# Patient Record
Sex: Female | Born: 1975
Health system: Southern US, Community
[De-identification: ages and names within clinical notes are randomized; demographics above are authoritative.]

## PROBLEM LIST (undated history)

## (undated) DIAGNOSIS — J45909 Unspecified asthma, uncomplicated: Secondary | ICD-10-CM

## (undated) DIAGNOSIS — K219 Gastro-esophageal reflux disease without esophagitis: Secondary | ICD-10-CM

## (undated) DIAGNOSIS — F419 Anxiety disorder, unspecified: Secondary | ICD-10-CM

## (undated) HISTORY — DX: Unspecified asthma, uncomplicated: J45.909

## (undated) HISTORY — PX: CHOLECYSTECTOMY: SHX55

## (undated) HISTORY — DX: Gastro-esophageal reflux disease without esophagitis: K21.9

---

## 2005-12-26 ENCOUNTER — Ambulatory Visit (HOSPITAL_COMMUNITY): Admission: RE | Admit: 2005-12-26 | Discharge: 2005-12-26 | Payer: Self-pay | Admitting: Family Medicine

## 2005-12-26 ENCOUNTER — Ambulatory Visit: Payer: Self-pay | Admitting: Family Medicine

## 2006-01-10 ENCOUNTER — Ambulatory Visit: Payer: Self-pay | Admitting: Gynecology

## 2006-01-24 ENCOUNTER — Ambulatory Visit: Payer: Self-pay | Admitting: Obstetrics & Gynecology

## 2006-01-31 ENCOUNTER — Ambulatory Visit: Payer: Self-pay | Admitting: Family Medicine

## 2006-02-07 ENCOUNTER — Ambulatory Visit: Payer: Self-pay | Admitting: Obstetrics & Gynecology

## 2006-02-14 ENCOUNTER — Ambulatory Visit: Payer: Self-pay | Admitting: Obstetrics & Gynecology

## 2006-02-19 ENCOUNTER — Ambulatory Visit: Payer: Self-pay | Admitting: Family Medicine

## 2006-02-21 ENCOUNTER — Ambulatory Visit: Payer: Self-pay | Admitting: Obstetrics & Gynecology

## 2006-02-23 ENCOUNTER — Ambulatory Visit: Payer: Self-pay | Admitting: Obstetrics & Gynecology

## 2006-02-28 ENCOUNTER — Ambulatory Visit: Payer: Self-pay | Admitting: Obstetrics and Gynecology

## 2006-02-28 ENCOUNTER — Inpatient Hospital Stay (HOSPITAL_COMMUNITY): Admission: AD | Admit: 2006-02-28 | Discharge: 2006-02-28 | Payer: Self-pay | Admitting: Family Medicine

## 2006-02-28 ENCOUNTER — Ambulatory Visit: Payer: Self-pay | Admitting: Obstetrics & Gynecology

## 2006-02-28 ENCOUNTER — Inpatient Hospital Stay (HOSPITAL_COMMUNITY): Admission: RE | Admit: 2006-02-28 | Discharge: 2006-03-03 | Payer: Self-pay | Admitting: Obstetrics and Gynecology

## 2006-04-12 ENCOUNTER — Encounter (INDEPENDENT_AMBULATORY_CARE_PROVIDER_SITE_OTHER): Payer: Self-pay | Admitting: Obstetrics & Gynecology

## 2006-04-12 ENCOUNTER — Ambulatory Visit: Payer: Self-pay | Admitting: Obstetrics & Gynecology

## 2007-04-07 ENCOUNTER — Inpatient Hospital Stay (HOSPITAL_COMMUNITY): Admission: AD | Admit: 2007-04-07 | Discharge: 2007-04-07 | Payer: Self-pay | Admitting: Obstetrics and Gynecology

## 2007-04-09 ENCOUNTER — Inpatient Hospital Stay (HOSPITAL_COMMUNITY): Admission: AD | Admit: 2007-04-09 | Discharge: 2007-04-09 | Payer: Self-pay | Admitting: Obstetrics & Gynecology

## 2007-12-12 ENCOUNTER — Ambulatory Visit (HOSPITAL_COMMUNITY): Admission: RE | Admit: 2007-12-12 | Discharge: 2007-12-12 | Payer: Self-pay | Admitting: Obstetrics & Gynecology

## 2008-08-06 ENCOUNTER — Encounter: Payer: Self-pay | Admitting: Obstetrics & Gynecology

## 2008-08-06 ENCOUNTER — Ambulatory Visit (HOSPITAL_COMMUNITY): Admission: AD | Admit: 2008-08-06 | Discharge: 2008-08-06 | Payer: Self-pay | Admitting: Obstetrics

## 2009-05-26 ENCOUNTER — Ambulatory Visit (HOSPITAL_COMMUNITY): Admission: RE | Admit: 2009-05-26 | Discharge: 2009-05-26 | Payer: Self-pay | Admitting: Obstetrics & Gynecology

## 2009-06-09 ENCOUNTER — Ambulatory Visit (HOSPITAL_COMMUNITY): Admission: RE | Admit: 2009-06-09 | Discharge: 2009-06-09 | Payer: Self-pay | Admitting: Obstetrics & Gynecology

## 2009-06-11 ENCOUNTER — Encounter: Payer: Self-pay | Admitting: Obstetrics & Gynecology

## 2009-06-11 ENCOUNTER — Ambulatory Visit (HOSPITAL_COMMUNITY): Admission: RE | Admit: 2009-06-11 | Discharge: 2009-06-11 | Payer: Self-pay | Admitting: Obstetrics & Gynecology

## 2009-09-30 ENCOUNTER — Encounter: Payer: Self-pay | Admitting: *Deleted

## 2009-10-21 ENCOUNTER — Ambulatory Visit: Payer: Self-pay | Admitting: Obstetrics and Gynecology

## 2009-10-21 LAB — CONVERTED CEMR LAB
AntiThromb III Func: 121 % (ref 76–126)
Anticardiolipin IgA: 1 (ref <10)
Anticardiolipin IgG: 3 (ref <10)
Anticardiolipin IgM: 3 (ref <10)
Homocysteine: 4.8 micromoles/L (ref 4.0–15.4)
Protein C Activity: 200 % — ABNORMAL HIGH (ref 75–133)
TSH: 1.616 microintl units/mL (ref 0.350–4.500)

## 2009-11-02 ENCOUNTER — Ambulatory Visit (HOSPITAL_COMMUNITY): Admission: RE | Admit: 2009-11-02 | Discharge: 2009-11-02 | Payer: Self-pay | Admitting: Family Medicine

## 2009-11-05 ENCOUNTER — Ambulatory Visit: Payer: Self-pay | Admitting: Obstetrics & Gynecology

## 2010-11-20 ENCOUNTER — Encounter: Payer: Self-pay | Admitting: Obstetrics & Gynecology

## 2011-02-04 LAB — CBC
HCT: 36.3 % (ref 36.0–46.0)
Platelets: 176 10*3/uL (ref 150–400)

## 2011-03-14 NOTE — Op Note (Signed)
[name], Tonya Summers               ACCOUNT NO.:  [id number]   MEDICAL RECORD NO.:  [id number]          PATIENT TYPE:  MAT   LOCATION:  [facility]                          [facility]:  [facility]   PHYSICIAN:  Roseanna Rainbow, M.D.DATE OF BIRTH:  Feb 04, 1976   DATE OF PROCEDURE:  08/06/2008  DATE OF DISCHARGE:                               OPERATIVE REPORT   PREOPERATIVE DIAGNOSIS:  Inevitable abortion.   POSTOPERATIVE DIAGNOSIS:  Inevitable abortion.   PROCEDURE:  Suction dilatation and curettage.   SURGEON:  Roseanna Rainbow, MD   ANESTHESIA:  Anesthesia care, paracervical block.   PATHOLOGY:  Products of conception.   ESTIMATED BLOOD LOSS:  100 mL.   COMPLICATIONS:  None.   PROCEDURE:  The patient was taken to the operating room with an IV  running.  She was placed in the dorsal lithotomy position and prepped  and draped in the usual sterile fashion.  After a time-out had been  completed, a bivalve speculum was placed in the patient's vagina.  The  cervix was noted to be approximately 1 cm dilated.  The anterior lip of  the cervix was then infiltrated with 2 mL of 2% lidocaine.  The single-  tooth tenaculum was then applied to this location.  4 mL of 2% lidocaine  were then injected at 4 o'clock and 7 o'clock to produce a paracervical  block.  A 7-mm suction curette was then advanced into the uterine  fundus.  The curette was then activated and rotated to evacuate the  uterus of the products of conception.  A sharp curettage was then  performed and gritty texture was noted.  A final pass was made with the  suction curette.  The single-tooth tenaculum was then removed with  minimal bleeding noted from the cervix.  At the close of the procedure,  the instrument and pack counts were said to be correct x2.  The patient  was taken to the PACU awake and in stable condition.      Roseanna Rainbow, M.D.  Electronically Signed     LAJ/MEDQ  D:  08/06/2008  T:  08/07/2008   Job:  [id number]

## 2011-03-14 NOTE — Op Note (Signed)
Tonya Summers, Tonya Summers               ACCOUNT NO.:  [id number]   MEDICAL RECORD NO.:  [id number]          PATIENT TYPE:  AMB   LOCATION:  [facility]                           [facility]   PHYSICIAN:  Roseanna Rainbow, M.D.DATE OF BIRTH:  09/06/1976   DATE OF PROCEDURE:  06/11/2009  DATE OF DISCHARGE:                               OPERATIVE REPORT   PREOPERATIVE DIAGNOSIS:  Embryonic demise.   POSTOPERATIVE DIAGNOSIS:  Embryonic demise.   PROCEDURES:  Suction, dilatation and curettage.   SURGEON:  Roseanna Rainbow, MD   ANESTHESIA:  Laryngeal mask airway, paracervical block.   PATHOLOGY:  Products of conception.   ESTIMATED BLOOD LOSS:  Minimal.   COMPLICATIONS:  None.   PROCEDURE IN DETAILS:  The patient was taken to the operating room with  an IV running.  A laryngeal mask airway was placed.  She was placed in  the dorsal lithotomy position and prepped and draped in the usual  sterile fashion.  After a time-out had been completed, a sterile  speculum was placed in the patient's vagina.  The anterior lip of the  cervix was infiltrated with 2 mL of 2% lidocaine.  The single-tooth  tenaculum was then applied to this location, 4 mL of 2% lidocaine were  then injected at 4 and 7 o'clock to produce a paracervical block.  The  cervix was then dilated with [facility] dilators.  A 7-mm suction curette was  then introduced into the intrauterine cavity.  The curette was activated  and rotated to evacuate the uterus of the products of conception.  A  sharp curettage was performed and a gritty texture was noted.  A final  pass was made with the suction curette.  The single-tooth tenaculum was  removed with minimal bleeding noted from the cervix.  At the end of the  procedure, the instrument and pack counts were said to be correct x2.  The patient was taken to the PACU awake and in stable condition.      Roseanna Rainbow, M.D.  Electronically Signed     LAJ/MEDQ  D:   06/11/2009  T:  06/11/2009  Job:  [id number]

## 2011-03-17 NOTE — Group Therapy Note (Signed)
NAMEROYETTA, PROBUS               ACCOUNT NO.:  [id number]   MEDICAL RECORD NO.:  [id number]          PATIENT TYPE:  WOC   LOCATION:  [facility]                   FACILITY:  [facility]   PHYSICIAN:  Dorthula Perfect, MD     DATE OF BIRTH:  29-Aug-1976   DATE OF SERVICE:                                    CLINIC NOTE   This 35 year old African female gravida 1 and now para 1, delivered  vaginally approximately 6 weeks ago.  She is both breast feeding and bottle  feeding.  She has had no vaginal bleeding. She is here for her 6-week  postpartum check.  I cannot find when her last Pap smear was done.   REVIEW OF SYSTEMS:  She is asymptomatic. She has no GI or GU complaints.   PERTINENT PHYSICAL EXAM:  ABDOMEN:  Soft and nontender.  PELVIC EXAMINATION:  External genitalia reveals moderate evidence of female  circumcision. The vaginal introitus is about 2 cm in diameter. A narrow  speculum is gently inserted and this was somewhat uncomfortable for the  patient.  The cervix is visualized. A Pap smear is done. Bimanual  examination is deferred because of the patient's cultural background.   IMPRESSION:  Normal 6-week postpartum exam.   DISPOSITION:  1.  Pap smear is done.  2.  The patient will be seen p.r.n. for care.  3.  The patient is told, through her mother who acts as an interpreter, that      she can expect a period in the next 3-6 months or longer. She is going      to breast feed for the next 6 months.           ______________________________  Dorthula Perfect, MD     ER/MEDQ  D:  04/12/2006  T:  04/12/2006  Job:  [id number]

## 2011-07-31 LAB — CBC
HCT: 37.3
WBC: 7.2

## 2011-08-17 LAB — CBC
HCT: 35 — ABNORMAL LOW
Hemoglobin: 11.9 — ABNORMAL LOW
Hemoglobin: 13.3
MCHC: 34.2
Platelets: 234
RBC: 4.44
RDW: 13.1
WBC: 6.5
WBC: 8.6

## 2011-08-17 LAB — WET PREP, GENITAL
Trich, Wet Prep: NONE SEEN
Yeast Wet Prep HPF POC: NONE SEEN

## 2011-08-17 LAB — GC/CHLAMYDIA PROBE AMP, GENITAL: Chlamydia, DNA Probe: NEGATIVE

## 2014-08-11 ENCOUNTER — Ambulatory Visit (INDEPENDENT_AMBULATORY_CARE_PROVIDER_SITE_OTHER): Payer: Self-pay | Admitting: Physician Assistant

## 2014-08-11 VITALS — BP 110/72 | HR 111 | Temp 98.1°F | Resp 18 | Ht 66.0 in | Wt 161.0 lb

## 2014-08-11 DIAGNOSIS — K219 Gastro-esophageal reflux disease without esophagitis: Secondary | ICD-10-CM

## 2014-08-11 MED ORDER — SUCRALFATE 1 G PO TABS: 1.0000 g | ORAL_TABLET | Freq: Three times a day (TID) | ORAL | Status: DC

## 2014-08-11 MED ORDER — OMEPRAZOLE 40 MG PO CPDR: 40.0000 mg | DELAYED_RELEASE_CAPSULE | Freq: Every day | ORAL | Status: DC

## 2014-08-11 MED ORDER — SUCRALFATE 1 GM/10ML PO SUSP: 1.0000 g | Freq: Three times a day (TID) | ORAL | Status: DC

## 2014-08-11 NOTE — Progress Notes (Signed)
I was directly involved with the patient's care and agree with the physical, diagnosis and treatment plan.  

## 2014-08-11 NOTE — Progress Notes (Signed)
   Subjective:    Patient ID: Tonya Summers, female    DOB: Sep 06, 1976, 38 y.o.   MRN: [id number]  HPI Patient presents for 7 days of worsening heartburn. Has had acid reflux for over a year and is usually controlled with Zantac 75. Has epigastric pain that usually does not radiate, but she feels it in her throat some mornings and has a sour taste in mouths. Has had 2 episode of yellowish spit-up. Denies nausea, diarrhea, or constipation. Eats a lot of spicy food. Denies drinking soda, coffee, or tea.   Review of Systems  Constitutional: Negative for fever and appetite change.  HENT: Negative for sore throat.   Respiratory: Negative for cough, chest tightness, shortness of breath and wheezing.   Cardiovascular: Negative for chest pain and palpitations.  Gastrointestinal: Positive for vomiting (minimal; 2 episodes). Negative for nausea, abdominal pain, diarrhea and constipation.  Allergic/Immunologic: Negative for environmental allergies and food allergies.       Objective:   Physical Exam  Constitutional: She is oriented to person, place, and time. She appears well-developed and well-nourished. No distress.  HENT:  Head: Normocephalic and atraumatic.  Right Ear: External ear normal.  Left Ear: External ear normal.  Mouth/Throat: Oropharynx is clear and moist. No oropharyngeal exudate.  Neck: Neck supple. No JVD present. No thyromegaly present.  Cardiovascular: Normal rate, regular rhythm, normal heart sounds and intact distal pulses.   No murmur heard. Pulmonary/Chest: Effort normal and breath sounds normal. No respiratory distress. She has no wheezes. She has no rales.  Abdominal: Soft. Normal appearance and bowel sounds are normal. She exhibits no distension and no mass. There is tenderness in the epigastric area. There is no rebound, no guarding and no CVA tenderness.  Lymphadenopathy:    She has no cervical adenopathy.  Neurological: She is alert and oriented to person, place, and  time.  Skin: Skin is warm and dry. No rash noted. She is not diaphoretic. No erythema. No pallor.   Blood pressure 110/72, pulse 111, temperature 98.1 F (36.7 C), temperature source Oral, resp. rate 18, height 5\' 6"  (1.676 m), weight 161 lb (73.029 kg), last menstrual period 07/24/2014, SpO2 100.00%.      Assessment & Plan:  1. Gastroesophageal reflux disease without esophagitis - HELICOBACTER PYLORI  ANTIBODY, IGM - omeprazole (PRILOSEC) 40 MG capsule; Take 1 capsule (40 mg total) by mouth daily.  Dispense: 30 capsule; Refill: 3 - sucralfate (CARAFATE) 1 G tablet; Take 1 tablet (1 g total) by mouth 4 (four) times daily -  with meals and at bedtime.  Dispense: 28 tablet; Refill: 0 - Continue to Zantac. Take 2 tablets daily prn.  Alveta Heimlich [name]  [facility] 08/11/2014 8:29 PM

## 2014-08-14 LAB — HELICOBACTER PYLORI  ANTIBODY, IGM: Helicobacter pylori, IgM: 0.7 U/mL (ref <9.0)

## 2014-08-19 ENCOUNTER — Telehealth: Payer: Self-pay | Admitting: Radiology

## 2014-08-19 NOTE — Telephone Encounter (Signed)
Pt's husband calling about lab results. They have not been reviewed yet. Please review and call him at [phone] with results. Thanks

## 2014-08-19 NOTE — Telephone Encounter (Signed)
Tishira, pt's husband stopped by. I let him know that her H Pylori was negative and that we would call him if you had in further comments.

## 2014-09-09 ENCOUNTER — Ambulatory Visit (INDEPENDENT_AMBULATORY_CARE_PROVIDER_SITE_OTHER): Payer: Self-pay | Admitting: Family Medicine

## 2014-09-09 VITALS — BP 128/74 | HR 101 | Temp 98.7°F | Resp 18 | Ht 67.0 in | Wt 154.0 lb

## 2014-09-09 DIAGNOSIS — K219 Gastro-esophageal reflux disease without esophagitis: Secondary | ICD-10-CM

## 2014-09-09 DIAGNOSIS — R1900 Intra-abdominal and pelvic swelling, mass and lump, unspecified site: Secondary | ICD-10-CM

## 2014-09-09 NOTE — Progress Notes (Signed)
I was asked to see the patient along with Brewington, [name] because of the extremely pulsatile abdominal aorta. The patient is only 64 his old, is a nonsmoker, but has a very pulsatile abdominal aorta in the epigastric region. No bruits could be auscultated. I do agree with recommendation that we get an ultrasound to make sure the aorta is normal.  Priscille Heidelberg.D.

## 2014-09-09 NOTE — Progress Notes (Signed)
   Subjective:    Patient ID: Tonya Summers, female    DOB: 1976/05/20, 38 y.o.   MRN: [id number]  HPI Patient presents because she is unsure if she needs a GI referral for her GERD. After treatment one month ago (08/11/14) with Zanctac, Prilosec, and Carafate, sx of epigastric pain, nausea, and sour brash only marginally improved. Went to another urgent care and received protonix, which helped. Sx are improved, but due to having brother die of colon cancer would like to make sure she does not need further eval.  On exam pulsatile mass found. Patient unsure when first appeared. Denies HA, dizziness, or orthopnea. Is not a smoker.   Review of Systems  Constitutional: Positive for appetite change (decreased). Negative for fever and fatigue.  Respiratory: Negative for shortness of breath.   Cardiovascular: Negative for chest pain, palpitations and leg swelling.  Gastrointestinal: Negative for nausea, vomiting and abdominal pain.  Allergic/Immunologic: Negative for environmental allergies and food allergies.  Neurological: Negative for dizziness, weakness, light-headedness and headaches.       Objective:   Physical Exam  Constitutional: She is oriented to person, place, and time. She appears well-developed and well-nourished. No distress.  Blood pressure 128/74, pulse 101, temperature 98.7 F (37.1 C), temperature source Oral, resp. rate 18, height 5\' 7"  (1.702 m), weight 154 lb (69.854 kg), last menstrual period 08/21/2014, SpO2 100 %.   HENT:  Head: Normocephalic and atraumatic.  Right Ear: External ear normal.  Left Ear: External ear normal.  Eyes: Right eye exhibits no discharge. Left eye exhibits no discharge. No scleral icterus.  Cardiovascular: Normal rate, regular rhythm, normal heart sounds and intact distal pulses.  Exam reveals no gallop and no friction rub.   No murmur heard. Pulmonary/Chest: Effort normal and breath sounds normal. She has no wheezes. She has no rales.    Abdominal: Soft. Bowel sounds are normal. She exhibits mass (pulsatile; left of abdomin extending from umbilicus to epigastric region. 3-3 1/2 cm wide.). She exhibits no distension and no abdominal bruit. There is no tenderness. There is no rebound and no guarding. No hernia.  Musculoskeletal: She exhibits no edema or tenderness.  Neurological: She is alert and oriented to person, place, and time.  Skin: Skin is warm and dry. No rash noted. She is not diaphoretic. No erythema. No pallor.        Assessment & Plan:  1. Pulsatile abdominal mass - US Abdomen Complete; Future  2. Gastroesophageal reflux disease without esophagitis Continue you with protonix as it is helping. GI ref not necessary at this time as sx controlled with medication.   Alveta Heimlich [name]  [facility] 09/09/2014 3:52 PM

## 2014-09-17 ENCOUNTER — Other Ambulatory Visit: Payer: Self-pay

## 2014-09-21 ENCOUNTER — Ambulatory Visit
Admission: RE | Admit: 2014-09-21 | Discharge: 2014-09-21 | Disposition: A | Discharge status: Home / Self Care | Payer: Medicaid Other | Source: Ambulatory Visit | Attending: Physician Assistant | Admitting: Physician Assistant

## 2014-09-21 DIAGNOSIS — R1900 Intra-abdominal and pelvic swelling, mass and lump, unspecified site: Secondary | ICD-10-CM

## 2014-09-22 ENCOUNTER — Telehealth: Payer: Self-pay | Admitting: Physician Assistant

## 2014-09-22 NOTE — Telephone Encounter (Signed)
Spoken to patient's husband (pre-approved). Relayed ultrasound findings. Aorta normal. If she has additional abdomenal pain can eval gall bladder with labs (amylase, AST, ALT, bili). She says she is not currently having any pain, but will RTC if pain arises.

## 2014-09-23 ENCOUNTER — Ambulatory Visit (INDEPENDENT_AMBULATORY_CARE_PROVIDER_SITE_OTHER): Payer: Self-pay | Admitting: Family Medicine

## 2014-09-23 VITALS — BP 124/84 | HR 106 | Temp 98.0°F | Resp 16 | Ht 67.0 in | Wt 149.0 lb

## 2014-09-23 DIAGNOSIS — R10A1 Flank pain, right side: Secondary | ICD-10-CM

## 2014-09-23 DIAGNOSIS — R109 Unspecified abdominal pain: Secondary | ICD-10-CM

## 2014-09-23 DIAGNOSIS — N390 Urinary tract infection, site not specified: Secondary | ICD-10-CM

## 2014-09-23 DIAGNOSIS — R1011 Right upper quadrant pain: Secondary | ICD-10-CM

## 2014-09-23 LAB — POCT CBC
Granulocyte percent: 60.1 % (ref 37–80)
HCT, POC: 38 % (ref 37.7–47.9)
Hemoglobin: 12 g/dL — AB (ref 12.2–16.2)
Lymph, poc: 1.2 (ref 0.6–3.4)
MCH, POC: 24.6 pg — AB (ref 27–31.2)
MCHC: 31.5 g/dL — AB (ref 31.8–35.4)
MCV: 78 fL — AB (ref 80–97)
MID (cbc): 0.2 (ref 0–0.9)
MPV: 10.1 fL (ref 0–99.8)
POC Granulocyte: 2.2 (ref 2–6.9)
POC LYMPH PERCENT: 34.1 % (ref 10–50)
POC MID %: 5.8 % (ref 0–12)
Platelet Count, POC: 229 K/uL (ref 142–424)
RBC: 4.87 M/uL (ref 4.04–5.48)
RDW, POC: 18.5 %
WBC: 3.6 K/uL — AB (ref 4.6–10.2)

## 2014-09-23 MED ORDER — ONDANSETRON 4 MG PO TBDP: 4.0000 mg | ORAL_TABLET | Freq: Three times a day (TID) | ORAL | Status: DC | PRN

## 2014-09-23 NOTE — Patient Instructions (Signed)
Food Choices for Gastroesophageal Reflux Disease When you have gastroesophageal reflux disease (GERD), the foods you eat and your eating habits are very important. Choosing the right foods can help ease the discomfort of GERD. WHAT GENERAL GUIDELINES DO I NEED TO FOLLOW? Choose fruits, vegetables, whole grains, low-fat dairy products, and low-fat meat, fish, and poultry. Limit fats such as oils, salad dressings, butter, nuts, and avocado. Keep a food diary to identify foods that cause symptoms. Avoid foods that cause reflux. These may be different for different people. Eat frequent small meals instead of three large meals each day. Eat your meals slowly, in a relaxed setting. Limit fried foods. Cook foods using methods other than frying. Avoid drinking alcohol. Avoid drinking large amounts of liquids with your meals. Avoid bending over or lying down until 2-3 hours after eating. WHAT FOODS ARE NOT RECOMMENDED? The following are some foods and drinks that may worsen your symptoms: Vegetables Tomatoes. Tomato juice. Tomato and spaghetti sauce. Chili peppers. Onion and garlic. Horseradish. Fruits Oranges, grapefruit, and lemon (fruit and juice). Meats High-fat meats, fish, and poultry. This includes hot dogs, ribs, ham, sausage, salami, and bacon. Dairy Whole milk and chocolate milk. Sour cream. Cream. Butter. Ice cream. Cream cheese.  Beverages Coffee and tea, with or without caffeine. Carbonated beverages or energy drinks. Condiments Hot sauce. Barbecue sauce.  Sweets/Desserts Chocolate and cocoa. Donuts. Peppermint and spearmint. Fats and Oils High-fat foods, including [place] fries and potato chips. Other Vinegar. Strong spices, such as black pepper, white pepper, red pepper, cayenne, curry powder, cloves, ginger, and chili powder. The items listed above may not be a complete list of foods and beverages to avoid. Contact your dietitian for more information. Document Released:  10/16/2005 Document Revised: 10/21/2013 Document Reviewed: 08/20/2013 [facility] Patient Information 2015 [place], [place]. This information is not intended to replace advice given to you by your health care provider. Make sure you discuss any questions you have with your health care provider. Cholelithiasis Cholelithiasis (also called gallstones) is a form of gallbladder disease in which gallstones form in your gallbladder. The gallbladder is an organ that stores bile made in the liver, which helps digest fats. Gallstones begin as small crystals and slowly grow into stones. Gallstone pain occurs when the gallbladder spasms and a gallstone is blocking the duct. Pain can also occur when a stone passes out of the duct.  RISK FACTORS  Being female.   Having multiple pregnancies. Health care providers sometimes advise removing diseased gallbladders before future pregnancies.   Being obese.  Eating a diet heavy in fried foods and fat.   Being older than 7 years and increasing age.   Prolonged use of medicines containing female hormones.   Having diabetes mellitus.   Rapidly losing weight.   Having a family history of gallstones (heredity).  SYMPTOMS  Nausea.   Vomiting.  Abdominal pain.   Yellowing of the skin (jaundice).   Sudden pain. It may persist from several minutes to several hours.  Fever.   Tenderness to the touch. In some cases, when gallstones do not move into the bile duct, people have no pain or symptoms. These are called "silent" gallstones.  TREATMENT Silent gallstones do not need treatment. In severe cases, emergency surgery may be required. Options for treatment include:  Surgery to remove the gallbladder. This is the most common treatment.  Medicines. These do not always work and may take 6-12 months or more to work.  Shock wave treatment (extracorporeal biliary lithotripsy). In this treatment  an ultrasound machine sends shock waves to the  gallbladder to break gallstones into smaller pieces that can pass into the intestines or be dissolved by medicine. HOME CARE INSTRUCTIONS   Only take over-the-counter or prescription medicines for pain, discomfort, or fever as directed by your health care provider.   Follow a low-fat diet until seen again by your health care provider. Fat causes the gallbladder to contract, which can result in pain.   Follow up with your health care provider as directed. Attacks are almost always recurrent and surgery is usually required for permanent treatment.  SEEK IMMEDIATE MEDICAL CARE IF:   Your pain increases and is not controlled by medicines.   You have a fever or persistent symptoms for more than 2-3 days.   You have a fever and your symptoms suddenly get worse.   You have persistent nausea and vomiting.  MAKE SURE YOU:   Understand these instructions.  Will watch your condition.  Will get help right away if you are not doing well or get worse. Document Released: 10/12/2005 Document Revised: 06/18/2013 Document Reviewed: 04/09/2013 [facility] Patient Information 2015 [place], [place]. This information is not intended to replace advice given to you by your health care provider. Make sure you discuss any questions you have with your health care provider.

## 2014-09-23 NOTE — Progress Notes (Signed)
Chief Complaint:  Chief Complaint  Patient presents with  . Flank Pain    right side x 3 weeks     HPI: Tonya Summers is a 38 y.o. female who is here for  3 week hx of right flnak and also RIQ abd pain Has had [place] of abd , ? gb sludge,  gb stones but nonspecific and nothing acute Afraid to to eat since has nausea. No vomitus, fevers or chills, no urinary sxs She ahs GERD and has been taking the medicines which helps. She has been avoiding all GERD  Triggering foods.   She came in recently bc wanted to check out her abdominal aorta since it pulsates so strongly Abd [place] is below from 09/21/14  IMPRESSION: 1. No abdominal aortic aneurysm is seen. 2. Echogenic debris within the gallbladder may represent gallbladder sludge or possibly non shadowing small gallstones. 3. The head and tail of the pancreas are obscured by bowel gas.   Electronically Signed  By: Ivar Drape M.D.  On: 09/21/2014 11:07  Past Medical History  Diagnosis Date  . Asthma   . GERD (gastroesophageal reflux disease)    No past surgical history on file. History   Social History  . Marital Status: Married    Spouse Name: N/A    Number of Children: N/A  . Years of Education: N/A   Social History Main Topics  . Smoking status: Never Smoker   . Smokeless tobacco: Never Used  . Alcohol Use: No  . Drug Use: No  . Sexual Activity: None   Other Topics Concern  . None   Social History Narrative   No family history on file. No Known Allergies Prior to Admission medications   Medication Sig Start Date End Date Taking? Authorizing Provider  omeprazole (PRILOSEC) 40 MG capsule Take 1 capsule (40 mg total) by mouth daily. 08/11/14  Yes Tishira R Brewington, [name]  pantoprazole (PROTONIX) 40 MG tablet Take 40 mg by mouth daily.   Yes Historical Provider, MD     ROS: The patient denies fevers, chills, night sweats, unintentional weight loss, chest pain, palpitations, wheezing, dyspnea on exertion,   dysuria, hematuria, melena, numbness, weakness, or tingling.  All other systems have been reviewed and were otherwise negative with the exception of those mentioned in the HPI and as above.    PHYSICAL EXAM: Filed Vitals:   09/23/14 1215  BP: 124/84  Pulse: 106  Temp: 98 F (36.7 C)  Resp: 16   Filed Vitals:   09/23/14 1215  Height: 5\' 7"  (1.702 m)  Weight: 149 lb (67.586 kg)   Body mass index is 23.33 kg/(m^2).  General: Alert, no acute distress HEENT:  Normocephalic, atraumatic, oropharynx patent. EOMI, PERRLA Cardiovascular:  Regular rate and rhythm, no rubs murmurs or gallops.  No Carotid bruits, radial pulse intact. No pedal edema.  Respiratory: Clear to auscultation bilaterally.  No wheezes, rales, or rhonchi.  No cyanosis, no use of accessory musculature GI: No organomegaly, abdomen is soft and minimally tender Right flanks and midepigastric, positive bowel sounds.  No masses. Skin: No rashes. Neurologic: Facial musculature symmetric. Psychiatric: Patient is appropriate throughout our interaction. Lymphatic: No cervical lymphadenopathy Musculoskeletal: Gait intact.   LABS: Results for orders placed or performed in visit on 09/23/14  COMPLETE METABOLIC PANEL WITH GFR  Result Value Ref Range   Sodium 135 135 - 145 mEq/L   Potassium 4.3 3.5 - 5.3 mEq/L   Chloride 101 96 - 112 mEq/L  CO2 24 19 - 32 mEq/L   Glucose, Bld 108 (H) 70 - 99 mg/dL   BUN 4 (L) 6 - 23 mg/dL   Creat 0.62 0.50 - 1.10 mg/dL   Total Bilirubin 1.0 0.2 - 1.2 mg/dL   Alkaline Phosphatase 46 39 - 117 U/L   AST 12 0 - 37 U/L   ALT <8 0 - 35 U/L   Total Protein 7.2 6.0 - 8.3 g/dL   Albumin 4.3 3.5 - 5.2 g/dL   Calcium 9.7 8.4 - 10.5 mg/dL   GFR, Est African American >89 mL/min   GFR, Est Non African American >89 mL/min  POCT CBC  Result Value Ref Range   WBC 3.6 (A) 4.6 - 10.2 K/uL   Lymph, poc 1.2 0.6 - 3.4   POC LYMPH PERCENT 34.1 10 - 50 %L   MID (cbc) 0.2 0 - 0.9   POC MID % 5.8 0 - 12  %M   POC Granulocyte 2.2 2 - 6.9   Granulocyte percent 60.1 37 - 80 %G   RBC 4.87 4.04 - 5.48 M/uL   Hemoglobin 12.0 (A) 12.2 - 16.2 g/dL   HCT, POC 38.0 37.7 - 47.9 %   MCV 78.0 (A) 80 - 97 fL   MCH, POC 24.6 (A) 27 - 31.2 pg   MCHC 31.5 (A) 31.8 - 35.4 g/dL   RDW, POC 18.5 %   Platelet Count, POC 229 142 - 424 K/uL   MPV 10.1 0 - 99.8 fL  POCT UA - Microscopic Only  Result Value Ref Range   WBC, Ur, HPF, POC 3-8    RBC, urine, microscopic neg    Bacteria, U Microscopic trace    Mucus, UA large    Epithelial cells, urine per micros 0-4    Crystals, Ur, HPF, POC neg    Casts, Ur, LPF, POC neg    Yeast, UA neg   POCT urinalysis dipstick  Result Value Ref Range   Color, UA yellow    Clarity, UA clear    Glucose, UA neg    Bilirubin, UA neg    Ketones, UA neg    Spec Grav, UA 1.015    Blood, UA trace-lysed    pH, UA 7.0    Protein, UA neg    Urobilinogen, UA 0.2    Nitrite, UA neg    Leukocytes, UA small (1+)      EKG/XRAY:   Primary read interpreted by Dr. Marin Comment at [facility].   ASSESSMENT/PLAN: Encounter Diagnoses  Name Primary?  . Abdominal pain, right upper quadrant Yes  . Right flank pain      Gross sideeffects, risk and benefits, and alternatives of medications d/w patient. Patient is aware that all medications have potential sideeffects and we are unable to predict every sideeffect or drug-drug interaction that may occur.  Mykel Mohl, [place], DO 09/27/2014 7:23 AM

## 2014-09-24 LAB — COMPLETE METABOLIC PANEL WITHOUT GFR
AST: 12 U/L (ref 0–37)
Albumin: 4.3 g/dL (ref 3.5–5.2)
BUN: 4 mg/dL — ABNORMAL LOW (ref 6–23)
Creat: 0.62 mg/dL (ref 0.50–1.10)
GFR, Est Non African American: >89 mL/min
Glucose, Bld: 108 mg/dL — ABNORMAL HIGH (ref 70–99)
Potassium: 4.3 meq/L (ref 3.5–5.3)

## 2014-09-24 LAB — COMPLETE METABOLIC PANEL WITH GFR
ALT: <8 U/L (ref 0–35)
Alkaline Phosphatase: 46 U/L (ref 39–117)
CO2: 24 mEq/L (ref 19–32)
Calcium: 9.7 mg/dL (ref 8.4–10.5)
Chloride: 101 mEq/L (ref 96–112)
GFR, Est African American: >89 mL/min
Sodium: 135 mEq/L (ref 135–145)
Total Bilirubin: 1 mg/dL (ref 0.2–1.2)
Total Protein: 7.2 g/dL (ref 6.0–8.3)

## 2014-09-25 LAB — POCT URINALYSIS DIPSTICK
Bilirubin, UA: NEGATIVE
Glucose, UA: NEGATIVE
Ketones, UA: NEGATIVE
Nitrite, UA: NEGATIVE
Protein, UA: NEGATIVE
Spec Grav, UA: 1.015
Urobilinogen, UA: 0.2
pH, UA: 7

## 2014-09-25 LAB — POCT UA - MICROSCOPIC ONLY
Casts, Ur, LPF, POC: NEGATIVE
Crystals, Ur, HPF, POC: NEGATIVE
RBC, urine, microscopic: NEGATIVE
Yeast, UA: NEGATIVE

## 2014-09-27 ENCOUNTER — Telehealth: Payer: Self-pay | Admitting: Family Medicine

## 2014-09-27 MED ORDER — CEPHALEXIN 500 MG PO CAPS: 500.0000 mg | ORAL_CAPSULE | Freq: Two times a day (BID) | ORAL | Status: DC

## 2014-09-27 NOTE — Telephone Encounter (Signed)
Spoke with patient's husband, rx for keflex for UTI sent to pharmacy. No urine cx since self pay/

## 2014-09-29 ENCOUNTER — Telehealth: Payer: Self-pay

## 2014-09-29 NOTE — Telephone Encounter (Signed)
Pt's husband called. States wife has questions about her medications. Please return call and advise. If calling back tonight, please call [phone] - if calling tomorrow, please call [phone].

## 2014-09-30 NOTE — Telephone Encounter (Signed)
Lm for rtn call 

## 2014-10-01 NOTE — Telephone Encounter (Signed)
LM for rtn call if still has questions regarding medication

## 2014-10-06 ENCOUNTER — Telehealth: Payer: Self-pay

## 2014-10-06 DIAGNOSIS — R1011 Right upper quadrant pain: Secondary | ICD-10-CM

## 2014-10-06 NOTE — Telephone Encounter (Signed)
Spoke with husband, she is going to stop omeprazole and take zantac sicne she had better results with zantac. She is still having nausea and midepi and RUQ abd pain after meals every few days. Doe snto matter waht she eats. Willr efer to [employer] due to [place] results and also the fact that PPI is not helping her.

## 2014-10-06 NOTE — Telephone Encounter (Signed)
Patients husband called requesting to speak with a nurse or provider in regards to his wife. Per spouse patient has finished all the medication but still having symptoms. I informed him she may be required to return to the clinic to be evaluated again. Spouse requesting I please just send a message at this time and is requesting to speak with Jethro Bolus LPN. Call back number is [phone]

## 2014-10-08 ENCOUNTER — Telehealth: Payer: Self-pay

## 2014-10-08 NOTE — Telephone Encounter (Signed)
Pts husband called stating wife is still very week and wants to know if she can take vitamins and what type of vitamins can she take?

## 2014-10-08 NOTE — Telephone Encounter (Signed)
Pt husband states that he is doing better. Advised him that she could take a prenatal vitamin or multivitamin.

## 2014-10-27 ENCOUNTER — Other Ambulatory Visit (INDEPENDENT_AMBULATORY_CARE_PROVIDER_SITE_OTHER): Payer: Self-pay | Admitting: Surgery

## 2014-10-27 NOTE — H&P (Signed)
Tonya Summers 10/27/2014 11:39 AM Location: [facility] Patient #: [id number] DOB: 05-Jul-1976 Married / Language: Undefined / Race: Undefined Female History of Present Illness Adin Hector MD; 10/27/2014 1:14 PM) Patient words: gallbladder.  The patient is a 38 year old female who presents for evaluation of gall stones. Patient sent for surgical consultation by Dr. Rikki Spearing for concern of symptomatic gallstones.  Pleasant woman struggling with intermittent abdominal pains for several months. He describes them as epigastric and right upper area. Usually stimulated by food. Greasy meals initially. Has had episodes of nausea with this. He presents appointment or vomiting. Discussed with her primary care physician. Concern of possible reflux. Placed on H2 blocker and then switched to a proton pump inhibitor. Still struggling with attacks. Ultrasound done which revealed sludge suspicious for gallstones. Because of lack of improvement on proton pump inhibitors and avoiding reflux inducing foods, surgical consultation requested Other Problems Marjean Donna, [place]; 10/27/2014 11:39 AM) Asthma  Past Surgical History Marjean Donna, [place]; 10/27/2014 11:39 AM) No pertinent past surgical history  Diagnostic Studies History Marjean Donna, CMA; 10/27/2014 11:39 AM) Colonoscopy never Mammogram never Pap Smear never  Allergies Davy Pique Bynum, CMA; 10/27/2014 11:41 AM) No Known Drug Allergies 10/27/2014  Medication History (Sonya Bynum, CMA; 10/27/2014 11:41 AM) Pantoprazole Sodium (40MG  Tablet DR, Oral) Active. Ondansetron (4MG  Tablet Disperse, Oral as needed) Active.  Social History Marjean Donna, CMA; 10/27/2014 11:39 AM) No alcohol use No caffeine use No drug use  Pregnancy / Birth History Marjean Donna, CMA; 10/27/2014 11:39 AM) Age at menarche 41 years. Gravida 6 Maternal age 70-30 Para 1 Regular periods     Review of Systems Davy Pique Bynum CMA; 10/27/2014  11:39 AM) Cardiovascular Not Present- Chest Pain, Difficulty Breathing Lying Down, Leg Cramps, Palpitations, Rapid Heart Rate, Shortness of Breath and Swelling of Extremities. Gastrointestinal Present- Nausea and Vomiting. Not Present- Abdominal Pain, Bloating, Bloody Stool, Change in Bowel Habits, Chronic diarrhea, Constipation, Difficulty Swallowing, Excessive gas, Gets full quickly at meals, Hemorrhoids, Indigestion and Rectal Pain. Female Genitourinary Not Present- Frequency, Nocturia, Painful Urination, Pelvic Pain and Urgency. Musculoskeletal Not Present- Back Pain, Joint Pain, Joint Stiffness, Muscle Pain, Muscle Weakness and Swelling of Extremities. Neurological Not Present- Decreased Memory, Fainting, Headaches, Numbness, Seizures, Tingling, Tremor, Trouble walking and Weakness. Psychiatric Not Present- Anxiety, Bipolar, Change in Sleep Pattern, Depression, Fearful and Frequent crying. Endocrine Not Present- Cold Intolerance, Excessive Hunger, Hair Changes, Heat Intolerance, Hot flashes and New Diabetes. Hematology Not Present- Easy Bruising, Excessive bleeding, Gland problems, HIV and Persistent Infections.  Vitals (Sonya Bynum CMA; 10/27/2014 11:40 AM) 10/27/2014 11:40 AM Weight: 141 lb Height: 66in Body Surface Area: 1.73 m Body Mass Index: 22.76 kg/m Temp.: 97.75F(Temporal)  Pulse: 77 (Regular)  BP: 124/76 (Sitting, Left Arm, Standard)     Physical Exam Adin Hector MD; 10/27/2014 12:25 PM)  General Mental Status-Alert. General Appearance-Not in acute distress, Not Sickly. Orientation-Oriented X3. Hydration-Well hydrated. Voice-Normal.  Integumentary Global Assessment Upon inspection and palpation of skin surfaces of the - Axillae: non-tender, no inflammation or ulceration, no drainage. and Distribution of scalp and body hair is normal. General Characteristics Temperature - normal warmth is noted.  Head and Neck Head-normocephalic,  atraumatic with no lesions or palpable masses. Face Global Assessment - atraumatic, no absence of expression. Neck Global Assessment - no abnormal movements, no bruit auscultated on the right, no bruit auscultated on the left, no decreased range of motion, non-tender. Trachea-midline. Thyroid Gland Characteristics - non-tender.  Eye Eyeball - Left-Extraocular movements intact,  No Nystagmus. Eyeball - Right-Extraocular movements intact, No Nystagmus. Cornea - Left-No Hazy. Cornea - Right-No Hazy. Sclera/Conjunctiva - Left-No scleral icterus, No Discharge. Sclera/Conjunctiva - Right-No scleral icterus, No Discharge. Pupil - Left-Direct reaction to light normal. Pupil - Right-Direct reaction to light normal.  ENMT Ears Pinna - Left - no drainage observed, no generalized tenderness observed. Right - no drainage observed, no generalized tenderness observed. Nose and Sinuses External Inspection of the Nose - no destructive lesion observed. Inspection of the nares - Left - quiet respiration. Right - quiet respiration. Mouth and Throat Lips - Upper Lip - no fissures observed, no pallor noted. Lower Lip - no fissures observed, no pallor noted. Nasopharynx - no discharge present. Oral Cavity/Oropharynx - Tongue - no dryness observed. Oral Mucosa - no cyanosis observed. Hypopharynx - no evidence of airway distress observed.  Chest and Lung Exam Inspection Movements - Normal and Symmetrical. Accessory muscles - No use of accessory muscles in breathing. Palpation Palpation of the chest reveals - Non-tender. Auscultation Breath sounds - Normal and Clear.  Cardiovascular Auscultation Rhythm - Regular. Murmurs & Other Heart Sounds - Auscultation of the heart reveals - No Murmurs and No Systolic Clicks.  Abdomen Inspection Inspection of the abdomen reveals - No Visible peristalsis and No Abnormal pulsations. Umbilicus - No Bleeding, No Urine  drainage. Palpation/Percussion Palpation and Percussion of the abdomen reveal - Soft, Non Tender, No Rebound tenderness, No Rigidity (guarding) and No Cutaneous hyperesthesia. Note: Mild soreness in right upper quadrant. No Murphy sign. Very thin abdomen. Aorta easily felt. Not enlarged.   Female Genitourinary Sexual Maturity Tanner 5 - Adult hair pattern. Note: No vaginal bleeding nor discharge   Peripheral Vascular Upper Extremity Inspection - Left - No Cyanotic nailbeds, Not Ischemic. Right - No Cyanotic nailbeds, Not Ischemic.  Neurologic Neurologic evaluation reveals -normal attention span and ability to concentrate, able to name objects and repeat phrases. Appropriate fund of knowledge , normal sensation and normal coordination. Mental Status Affect - not angry, not paranoid. Cranial Nerves-Normal Bilaterally. Gait-Normal.  Neuropsychiatric Mental status exam performed with findings of-able to articulate well with normal speech/language, rate, volume and coherence, thought content normal with ability to perform basic computations and apply abstract reasoning and no evidence of hallucinations, delusions, obsessions or homicidal/suicidal ideation.  Musculoskeletal Global Assessment Spine, Ribs and Pelvis - no instability, subluxation or laxity. Right Upper Extremity - no instability, subluxation or laxity.  Lymphatic Head & Neck  General Head & Neck Lymphatics: Bilateral - Description - No Localized lymphadenopathy. Axillary  General Axillary Region: Bilateral - Description - No Localized lymphadenopathy. Femoral & Inguinal  Generalized Femoral & Inguinal Lymphatics: Left - Description - No Localized lymphadenopathy. Right - Description - No Localized lymphadenopathy.    Assessment & Plan Adin Hector MD; 10/27/2014 1:15 PM)  CHRONIC CHOLECYSTITIS WITH CALCULUS (574.10  K80.10) Impression: I think she has a classic story biliary colic. There was some  chronic soreness suspicious for chronic cholecystitis. No improvement on antacid regimen argues against GERD. Rest of differential diagnosis seems unlikely.  I offered cholecystectomy. Reasonable single site approach. Went over it again numerous times to the patient and her husband and child. Questions answered. They agreed to proceed.  Current Plans Schedule for Surgery  The anatomy & physiology of hepatobiliary & pancreatic function was discussed.  The pathophysiology of gallbladder dysfunction was discussed.  Natural history risks without surgery was discussed.   I feel the risks of no intervention will lead to serious problems that outweigh  the operative risks; therefore, I recommended cholecystectomy to remove the pathology.  I explained laparoscopic techniques with possible need for an open approach.  Probable cholangiogram to evaluate the bilary tract was explained as well.    Risks such as bleeding, infection, abscess, leak, injury to other organs, need for further treatment, stroke, heart attack, death, and other risks were discussed.  I noted a good likelihood this will help address the problem.  Possibility that this will not correct all abdominal symptoms was explained.  Goals of post-operative recovery were discussed as well.  We will work to minimize complications.  An educational handout further explaining the pathology and treatment options was given as well.  Questions were answered.  The patient expresses understanding & wishes to proceed with surgery.    Pt Education - [facility] Laparosopic Post Op HCI (Thos Matsumoto) Pt Education - [facility] Good Bowel Health (Nathin Saran) Pt Education - [facility] Pain Control (Ardian Haberland)  Adin Hector, M.D., F.A.C.S. Gastrointestinal and Minimally Invasive Surgery [facility] [address], [place], [place] [address] [phone] Main / Paging

## 2014-12-04 ENCOUNTER — Other Ambulatory Visit: Payer: Self-pay | Admitting: Surgery

## 2015-01-11 ENCOUNTER — Ambulatory Visit (INDEPENDENT_AMBULATORY_CARE_PROVIDER_SITE_OTHER): Payer: Self-pay | Admitting: Family Medicine

## 2015-01-11 VITALS — BP 120/72 | HR 101 | Temp 97.9°F | Resp 18 | Ht 66.5 in | Wt 123.0 lb

## 2015-01-11 DIAGNOSIS — K219 Gastro-esophageal reflux disease without esophagitis: Secondary | ICD-10-CM

## 2015-01-11 DIAGNOSIS — R634 Abnormal weight loss: Secondary | ICD-10-CM

## 2015-01-11 DIAGNOSIS — R112 Nausea with vomiting, unspecified: Secondary | ICD-10-CM

## 2015-01-11 DIAGNOSIS — R1013 Epigastric pain: Secondary | ICD-10-CM

## 2015-01-11 LAB — COMPLETE METABOLIC PANEL WITH GFR
BUN: 6 mg/dL (ref 6–23)
CO2: 27 mEq/L (ref 19–32)
Calcium: 9.7 mg/dL (ref 8.4–10.5)
Chloride: 102 mEq/L (ref 96–112)
Creat: 0.56 mg/dL (ref 0.50–1.10)
GFR, Est African American: >89 mL/min
GFR, Est Non African American: >89 mL/min
Glucose, Bld: 95 mg/dL (ref 70–99)
Sodium: 139 mEq/L (ref 135–145)
Total Bilirubin: 1 mg/dL (ref 0.2–1.2)
Total Protein: 7 g/dL (ref 6.0–8.3)

## 2015-01-11 LAB — POCT CBC
Granulocyte percent: 63.2 % (ref 37–80)
HCT, POC: 36.1 % — AB (ref 37.7–47.9)
Hemoglobin: 11.1 g/dL — AB (ref 12.2–16.2)
Lymph, poc: 1.1 (ref 0.6–3.4)
MCH, POC: 25.9 pg — AB (ref 27–31.2)
MCHC: 30.8 g/dL — AB (ref 31.8–35.4)
MCV: 84 fL (ref 80–97)
MID (cbc): 0.2 (ref 0–0.9)
MPV: 9.9 fL (ref 0–99.8)
POC Granulocyte: 2.2 (ref 2–6.9)
POC LYMPH PERCENT: 31.3 %L (ref 10–50)
POC MID %: 5.5 %M (ref 0–12)
Platelet Count, POC: 251 10*3/uL (ref 142–424)
RBC: 4.29 M/uL (ref 4.04–5.48)
RDW, POC: 17.6 %
WBC: 3.5 10*3/uL — AB (ref 4.6–10.2)

## 2015-01-11 LAB — COMPLETE METABOLIC PANEL WITHOUT GFR
ALT: <8 U/L (ref 0–35)
AST: 9 U/L (ref 0–37)
Albumin: 4.2 g/dL (ref 3.5–5.2)
Alkaline Phosphatase: 48 U/L (ref 39–117)
Potassium: 4.4 meq/L (ref 3.5–5.3)

## 2015-01-11 NOTE — Progress Notes (Signed)
Chief Complaint:  Chief Complaint  Patient presents with  . Nausea    every morning after gallbladder surgery 5 mths ago  . Abdominal Pain    HPI: Tonya Summers is a 39 y.o. female who is here for nausea every morning for the last 5-7 days.. She denies any abdominal pain after her gallbladder. She had  gallbladder removal on 12/04/2013 she went back to see Dr. gross on February 22 and he said that everything was okay. Blood work was not done at that time. Ultrasound was not done at that time. She was given an anti-emetics for it. Zantac in the morning for reflux symptoms. Has had some weight loss. She can't eat her normal food and she has been status post cholecystectomy for 6 weeks. She avoids most GERD inducing symptoms. She ate food chicken and rice. She states that food ingestion actually makes it feel better. She was doing well after her surgery but then about 5-6 days ago started feeling nauseated She is not pregnant, last menstrual period she is currently on her cycle.    Wt Readings from Last 3 Encounters:  01/11/15 123 lb (55.792 kg)  09/23/14 149 lb (67.586 kg)  09/09/14 154 lb (69.854 kg)   No PCP  Past Medical History  Diagnosis Date  . Asthma   . GERD (gastroesophageal reflux disease)    Past Surgical History  Procedure Laterality Date  . Cholecystectomy     History   Social History  . Marital Status: Married    Spouse Name: N/A  . Number of Children: N/A  . Years of Education: N/A   Social History Main Topics  . Smoking status: Never Smoker   . Smokeless tobacco: Never Used  . Alcohol Use: No  . Drug Use: No  . Sexual Activity: Not on file   Other Topics Concern  . None   Social History Narrative   History reviewed. No pertinent family history. No Known Allergies Prior to Admission medications   Medication Sig Start Date End Date Taking? Authorizing Provider  ondansetron (ZOFRAN ODT) 4 MG disintegrating tablet Take 1 tablet (4 mg total)  by mouth every 8 (eight) hours as needed for nausea or vomiting. 09/23/14  Yes Yvana Samonte P Allisha Harter, DO  ranitidine (ZANTAC) 150 MG tablet Take 150 mg by mouth 2 (two) times daily.   Yes Historical Provider, MD  cephALEXin (KEFLEX) 500 MG capsule Take 1 capsule (500 mg total) by mouth 2 (two) times daily. Patient not taking: Reported on 01/11/2015 09/27/14   Deunta Beneke P Demani Mcbrien, DO  omeprazole (PRILOSEC) 40 MG capsule Take 1 capsule (40 mg total) by mouth daily. Patient not taking: Reported on 01/11/2015 08/11/14   Tishira R Brewington, [name]  pantoprazole (PROTONIX) 40 MG tablet Take 40 mg by mouth daily.    Historical Provider, MD     ROS: The patient denies fevers, chills, night sweats,  chest pain, palpitations, wheezing, dyspnea on exertion,  vomiting, abdominal pain, dysuria, hematuria, melena, numbness, weakness, or tingling.   All other systems have been reviewed and were otherwise negative with the exception of those mentioned in the HPI and as above.    PHYSICAL EXAM: Filed Vitals:   01/11/15 1029  BP: 120/72  Pulse: 101  Temp: 97.9 F (36.6 C)  Resp: 18   Filed Vitals:   01/11/15 1029  Height: 5' 6.5" (1.689 m)  Weight: 123 lb (55.792 kg)   Body mass index is 19.56 kg/(m^2).  General: Alert, no  acute distress HEENT:  Normocephalic, atraumatic, oropharynx patent. EOMI, PERRLA Cardiovascular:  Regular rate and rhythm, no rubs murmurs or gallops.  No Carotid bruits, radial pulse intact. No pedal edema.  Respiratory: Clear to auscultation bilaterally.  No wheezes, rales, or rhonchi.  No cyanosis, no use of accessory musculature GI: No organomegaly, abdomen is soft and non-tender, positive bowel sounds.  No masses. Skin: No rashes. Neurologic: Facial musculature symmetric. Psychiatric: Patient is appropriate throughout our interaction. Lymphatic: No cervical lymphadenopathy Musculoskeletal: Gait intact.   LABS: Results for orders placed or performed in visit on 01/11/15  POCT CBC  Result  Value Ref Range   WBC 3.5 (A) 4.6 - 10.2 K/uL   Lymph, poc 1.1 0.6 - 3.4   POC LYMPH PERCENT 31.3 10 - 50 %L   MID (cbc) 0.2 0 - 0.9   POC MID % 5.5 0 - 12 %M   POC Granulocyte 2.2 2 - 6.9   Granulocyte percent 63.2 37 - 80 %G   RBC 4.29 4.04 - 5.48 M/uL   Hemoglobin 11.1 (A) 12.2 - 16.2 g/dL   HCT, POC 36.1 (A) 37.7 - 47.9 %   MCV 84.0 80 - 97 fL   MCH, POC 25.9 (A) 27 - 31.2 pg   MCHC 30.8 (A) 31.8 - 35.4 g/dL   RDW, POC 17.6 %   Platelet Count, POC 251 142 - 424 K/uL   MPV 9.9 0 - 99.8 fL     EKG/XRAY:   Primary read interpreted by Dr. Marin Comment at [facility].   ASSESSMENT/PLAN: Encounter Diagnoses  Name Primary?  . Non-intractable vomiting with nausea, vomiting of unspecified type Yes  . Gastroesophageal reflux disease without esophagitis   . Epigastric pain   . Loss of weight    Will await for labs. CMP, H. pylori pending. We will try a trial of omeprazole in the morning and Zantac at night. If that helps no referral needed to GI as long as CMP and H. pylori are within normal limits.. Referral to GI  if labs are normal and nausea persist Don't think that I will do any advanced imaging studies at this time and refer her back to Gen. surgery to see if they want to do any type of repeat ultrasound or an MRCP if her nausea is not attributed to GI related issues. She will need to be referred to someone who accepts Medicaid Will not do an anemia workup. She currently has a hemoglobin that ranges between 12-11. She has had hemoglobin of 11 before, currently on menstrual cycle. Follow-up when necessary  Gross sideeffects, risk and benefits, and alternatives of medications d/w patient. Patient is aware that all medications have potential sideeffects and we are unable to predict every sideeffect or drug-drug interaction that may occur.  Bristyn Kulesza, [place], DO 01/11/2015 1:27 PM

## 2015-01-12 LAB — H. PYLORI BREATH TEST: H. pylori Breath Test: NOT DETECTED

## 2015-01-14 ENCOUNTER — Encounter: Payer: Self-pay | Admitting: Family Medicine

## 2015-01-14 ENCOUNTER — Other Ambulatory Visit: Payer: Self-pay | Admitting: Family Medicine

## 2015-01-14 ENCOUNTER — Telehealth: Payer: Self-pay | Admitting: Family Medicine

## 2015-01-14 DIAGNOSIS — R1013 Epigastric pain: Secondary | ICD-10-CM

## 2015-01-14 DIAGNOSIS — R112 Nausea with vomiting, unspecified: Secondary | ICD-10-CM

## 2015-01-14 NOTE — Telephone Encounter (Signed)
Patient's husband said he missed a call from Dr. Marin Comment.  [phone]

## 2015-01-14 NOTE — Telephone Encounter (Signed)
Patients husband called in again requesting to speak with Dr. Marin Comment, stated she had called him earlier and he had missed the call, apparently there was a message left about referring his wife to a specialist and he wanted to confirm that and get some more information. If someone could please call him back his number is [phone].

## 2015-01-15 ENCOUNTER — Other Ambulatory Visit: Payer: Self-pay | Admitting: Family Medicine

## 2015-01-15 DIAGNOSIS — K219 Gastro-esophageal reflux disease without esophagitis: Secondary | ICD-10-CM

## 2015-01-15 MED ORDER — DEXLANSOPRAZOLE 30 MG PO CPDR: 30.0000 mg | DELAYED_RELEASE_CAPSULE | Freq: Every day | ORAL | Status: DC

## 2015-01-15 NOTE — Telephone Encounter (Signed)
Husband would like a call back from Dr. Marin Comment

## 2015-01-15 NOTE — Telephone Encounter (Signed)
Dr Marin Comment: Did you call this pt?

## 2015-01-17 NOTE — Telephone Encounter (Signed)
Spoke with the patients wife.

## 2015-01-19 ENCOUNTER — Encounter: Payer: Self-pay | Admitting: Nurse Practitioner

## 2015-01-29 ENCOUNTER — Encounter: Payer: Self-pay | Admitting: Nurse Practitioner

## 2015-01-29 ENCOUNTER — Ambulatory Visit (INDEPENDENT_AMBULATORY_CARE_PROVIDER_SITE_OTHER): Payer: Medicaid Other | Admitting: Nurse Practitioner

## 2015-01-29 VITALS — BP 116/70 | HR 96 | Ht 66.75 in | Wt 121.2 lb

## 2015-01-29 DIAGNOSIS — R110 Nausea: Secondary | ICD-10-CM | POA: Diagnosis not present

## 2015-01-29 NOTE — Progress Notes (Signed)
    HPI :   Patient is a 39 year old female from [place] referred by PCP for evaluation of nausea.  She saw PCP late November with postprandial right upper quadrant pain, nausea and vomiting. Abdominal ultrasound suggested gallbladder sludge or possibly gallstones. LFTs were normal.  She was started on PPI , then switched to an H2 blocker. Nausea and upper abdominal pain persisted, she lost a significant amount of weight so patient was sent for surgical evaluation and underwent a cholecystectomy early February by Dr. Johney Maine. Gallbladder pathology compatible with minimal chronic inflammation and cholelithiasis. Following surgery her pain and vomiting resolved but nausea persisted. Patient now on a PPI in the am and an H2 blocker at bedtime and her nausea is much better. Eating also helps but patient still chooses not to eat or drink much until late afternoon because of her husband's work schedule. She seldom has any abdominal pain. Bowel movements are fine. No blood in stool. Occasional pyrosis. No recent NSAID use.  CBC and CMP mid-March were unremarkable.   Past Medical History  Diagnosis Date  . Asthma   . GERD (gastroesophageal reflux disease)     Past Surgical History  Procedure Laterality Date  . Cholecystectomy      History reviewed. No pertinent family history. History  Substance Use Topics  . Smoking status: Never Smoker   . Smokeless tobacco: Never Used  . Alcohol Use: No   Current Outpatient Prescriptions  Medication Sig Dispense Refill  . Dexlansoprazole 30 MG capsule Take 1 capsule (30 mg total) by mouth daily. Stop omeprazole, continue with zantac 30 capsule 0  . ranitidine (ZANTAC) 150 MG tablet Take 150 mg by mouth daily.      No current facility-administered medications for this visit.   No Known Allergies   Review of Systems: All systems reviewed and negative except where noted in HPI.   Physical Exam: BP 116/70 mmHg  Pulse 96  Ht 5' 6.75" (1.695 m)  Wt 121 lb 4  oz (54.999 kg)  BMI 19.14 kg/m2  LMP 01/02/2015 Constitutional: Pleasant,well-developed, female in no acute distress. HEENT: Normocephalic and atraumatic. Conjunctivae are normal. No scleral icterus. Neck supple.  Cardiovascular: Normal rate, regular rhythm.  Pulmonary/chest: Effort normal and breath sounds normal. No wheezing, rales or rhonchi. Abdominal: Three dime size scars in shape of triangle on mid abdomen. Abdomen soft, nondistended, nontender. Bowel sounds active throughout. There are no masses palpable. No hepatomegaly. Extremities: no edema Lymphadenopathy: No cervical adenopathy noted. Neurological: Alert and oriented to person place and time. Skin: Skin is warm and dry. No rashes noted. Psychiatric: Normal mood and affect. Behavior is normal.   ASSESSMENT AND PLAN:  39 year old female who is s/p cholecystectomy in February for postprandial nausea / vomiting and upper abdominal pain. Symptoms resolved except for nausea which is better with food and also with regimen of am PPI and nightime H2 blocker. Patient has lost several pounds over last few months, though most of which was prior to cholecystectomy when she was having vomiting and pain. . Etiology of persistent nausea unclear For further evaluation will schedule her for EGD. If this is negative and nausea persists will consider gastric emptying scan. Continue regimen of PPI / H2 blocker. Recommended small frequent meals.     CC: Rikki Spearing, DO

## 2015-01-29 NOTE — Patient Instructions (Addendum)
Eat small frequent meals.  Continue Zantac and Dexlansoprazole 30 mg.  Take daily.  You have been scheduled for an endoscopy. Please follow written instructions given to you at your visit today. If you use inhalers (even only as needed), please bring them with you on the day of your procedure.  We will put you on a cancellation list for a sooner morning appointment.

## 2015-02-01 NOTE — Progress Notes (Signed)
Reviewed and agree with management. Keyana Guevara D. Larya Charpentier, M.D., FACG  

## 2015-03-04 ENCOUNTER — Encounter: Payer: Self-pay | Admitting: Gastroenterology

## 2015-03-04 ENCOUNTER — Ambulatory Visit (AMBULATORY_SURGERY_CENTER): Payer: Medicaid Other | Admitting: Gastroenterology

## 2015-03-04 VITALS — BP 100/55 | HR 79 | Temp 99.2°F | Resp 12 | Ht 66.75 in | Wt 121.0 lb

## 2015-03-04 DIAGNOSIS — R110 Nausea: Secondary | ICD-10-CM | POA: Diagnosis not present

## 2015-03-04 DIAGNOSIS — R112 Nausea with vomiting, unspecified: Secondary | ICD-10-CM

## 2015-03-04 MED ORDER — SODIUM CHLORIDE 0.9 % IV SOLN: 500.0000 mL | INTRAVENOUS | Status: DC

## 2015-03-04 NOTE — Progress Notes (Signed)
On hold for PACU

## 2015-03-04 NOTE — Patient Instructions (Signed)

## 2015-03-04 NOTE — Op Note (Signed)
[place]  [address]. [place], [address]   ENDOSCOPY PROCEDURE REPORT  PATIENT: Tonya, Summers  MR#: [id number] BIRTHDATE: 02/16/1976 , 39  yrs. old GENDER: female ENDOSCOPIST: Inda Castle, MD REFERRED BY: PROCEDURE DATE:  03/04/2015 PROCEDURE:  EGD, diagnostic ASA CLASS:     Class II INDICATIONS:  nausea and vomiting. MEDICATIONS: Monitored anesthesia care and Propofol 200 mg IV TOPICAL ANESTHETIC:  DESCRIPTION OF PROCEDURE: After the risks benefits and alternatives of the procedure were thoroughly explained, informed consent was obtained.  The LB [id number] endoscope was introduced through the mouth and advanced to the third portion of the duodenum , Without limitations.  The instrument was slowly withdrawn as the mucosa was fully examined.      EXAM: The esophagus and gastroesophageal junction were completely normal in appearance.  The stomach was entered and closely examined.The antrum, angularis, and lesser curvature were well visualized, including a retroflexed view of the cardia and fundus. The stomach wall was normally distensable.  The scope passed easily through the pylorus into the duodenum.  Retroflexed views revealed no abnormalities.     The scope was then withdrawn from the patient and the procedure completed.  COMPLICATIONS: There were no immediate complications.  ENDOSCOPIC IMPRESSION: Normal appearing esophagus and GE junction, the stomach was well visualized and normal in appearance, normal appearing duodenum  RECOMMENDATIONS: 1.  Discontinue dexilant and Zantac 2.  My office will arrange for you to have a Gastric Emptying Scan performed.  This is a radiology test that gives an idea of how well your stomach functions.  REPEAT EXAM:  eSigned:  Inda Castle, MD 03/04/2015 3:19 PM    CC:  PATIENT NAME:  Tonya, Summers MR#: [id number]

## 2015-03-05 ENCOUNTER — Telehealth: Payer: Self-pay | Admitting: *Deleted

## 2015-03-05 NOTE — Telephone Encounter (Signed)
  Follow up Call-  Call back number 03/04/2015  Post procedure Call Back phone  # [phone]  Permission to leave phone message Yes     No answer at # given.  Left message on voicemail.

## 2015-03-08 ENCOUNTER — Other Ambulatory Visit: Payer: Self-pay

## 2015-03-08 ENCOUNTER — Telehealth: Payer: Self-pay

## 2015-03-08 DIAGNOSIS — R112 Nausea with vomiting, unspecified: Secondary | ICD-10-CM

## 2015-03-08 NOTE — Telephone Encounter (Signed)
I have left message for the patient to call back. She is to have a GES on 03/23/15 at 7:30 am. Arrive at 7:15 at [facility]. NPO for 6 hours prior. No GI meds for 24 hours prior. Prior authorization requested.

## 2015-03-08 NOTE — Telephone Encounter (Signed)
Letter with instructions mailed

## 2015-03-08 NOTE — Telephone Encounter (Signed)
Notified. 

## 2015-03-23 ENCOUNTER — Encounter (HOSPITAL_COMMUNITY)
Admission: RE | Admit: 2015-03-23 | Discharge: 2015-03-23 | Disposition: A | Discharge status: Home / Self Care | Payer: Medicaid Other | Source: Ambulatory Visit | Attending: Gastroenterology | Admitting: Gastroenterology

## 2015-03-23 DIAGNOSIS — R112 Nausea with vomiting, unspecified: Secondary | ICD-10-CM | POA: Insufficient documentation

## 2015-03-23 MED ORDER — TECHNETIUM TC 99M SULFUR COLLOID
2.0000 | Freq: Once | INTRAVENOUS | Status: AC | PRN
  Administered 2015-03-23: 2 via INTRAVENOUS

## 2015-03-25 ENCOUNTER — Other Ambulatory Visit: Payer: Self-pay

## 2015-03-25 MED ORDER — METOCLOPRAMIDE HCL 10 MG PO TABS: 10.0000 mg | ORAL_TABLET | Freq: Three times a day (TID) | ORAL | Status: DC

## 2015-03-27 ENCOUNTER — Encounter (HOSPITAL_COMMUNITY): Payer: Self-pay | Admitting: Emergency Medicine

## 2015-03-27 ENCOUNTER — Emergency Department (HOSPITAL_COMMUNITY)
Admission: EM | Admit: 2015-03-27 | Discharge: 2015-03-27 | Disposition: A | Discharge status: Home / Self Care | Payer: Medicaid Other | Attending: Emergency Medicine | Admitting: Emergency Medicine

## 2015-03-27 DIAGNOSIS — K219 Gastro-esophageal reflux disease without esophagitis: Secondary | ICD-10-CM | POA: Insufficient documentation

## 2015-03-27 DIAGNOSIS — J45909 Unspecified asthma, uncomplicated: Secondary | ICD-10-CM | POA: Insufficient documentation

## 2015-03-27 DIAGNOSIS — J029 Acute pharyngitis, unspecified: Secondary | ICD-10-CM | POA: Diagnosis present

## 2015-03-27 MED ORDER — RANITIDINE HCL 150 MG/10ML PO SYRP
300.0000 mg | ORAL_SOLUTION | Freq: Once | ORAL | Status: AC
  Administered 2015-03-27: 300 mg via ORAL
  Filled 2015-03-27 (×2): qty 20

## 2015-03-27 NOTE — ED Notes (Signed)
Pt ambulating to restroom with steady gait

## 2015-03-27 NOTE — ED Notes (Signed)
Pt c/o sore throat, she denies jaw swelling when this RN asks. Throat is red, Lung sounds clear bilaterally.

## 2015-03-27 NOTE — Discharge Instructions (Signed)
Gastritis, Adult Tonya Summers, start taking zantac every 12 hours for your pain.  See your stomach doctor within 1 week for close follow up.  Return to the ED immediately if you have any swelling, itching, or red rashes.  If any symptoms worsen, come back to the ED.  Thank you.          12    .     1       .                  .         . . alssayidat muhammad , albad' fi aittikhadh zanitak kl 12 saeatan l 'almak . ruyat tabib maeaddatuk hudud 1 fi al'usbue l mutabaeat ean qurb . aleawdat 'iilaa aldduef aljinsi ealaa alfawr 'iidha kan ladayk 'ay tawrim , hukkatan , 'aw tafah 'ahmar . 'iidha sa'at 'ay 'aerad , yaeud 'iilaa aldduef aljinsi. shukra.  Gastritis is soreness and puffiness (inflammation) of the lining of the stomach. If you do not get help, gastritis can cause bleeding and sores (ulcers) in the stomach. HOME CARE   Only take medicine as told by your doctor.  If you were given antibiotic medicines, take them as told. Finish the medicines even if you start to feel better.  Drink enough fluids to keep your pee (urine) clear or pale yellow.  Avoid foods and drinks that make your problems worse. Foods you may want to avoid include:  Caffeine or alcohol.  Chocolate.  Mint.  Garlic and onions.  Spicy foods.  Citrus fruits, including oranges, lemons, or limes.  Food containing tomatoes, including sauce, chili, salsa, and pizza.  Fried and fatty foods.  Eat small meals throughout the day instead of large meals. GET HELP RIGHT AWAY IF:   You have black or dark red poop (stools).  You throw up (vomit) blood. It may look like coffee grounds.  You cannot keep fluids down.  Your belly (abdominal) pain gets worse.  You have a fever.  You do not feel better after 1 week.  You have any other questions or  concerns. MAKE SURE YOU:   Understand these instructions.  Will watch your condition.  Will get help right away if you are not doing well or get worse. Document Released: 04/03/2008 Document Revised: 01/08/2012 Document Reviewed: 11/29/2011 [facility] Patient Information 2015 [place], [place]. This information is not intended to replace advice given to you by your health care provider. Make sure you discuss any questions you have with your health care provider.

## 2015-03-27 NOTE — ED Notes (Signed)
Pt arrived to the ED with a complaint of an allergic reaction.  Pt states she had an endoscopy on 5-5. Pt states that she was prescribed Reglan yesterday and has taken 5 tablets in the last 24 hours.  Pt states she feels as if her jaw is swelling and it is painful

## 2015-03-27 NOTE — ED Notes (Signed)
Pt left prior to obtaining DC vitals and prior to RN post medication assessment. Pt left with DC papers. She was advised to follow up with GI doctor on Monday and to OTC Zantac for acid reflux.

## 2015-03-27 NOTE — ED Notes (Signed)
Pt transported from car [place]. Pt started taking Reglan today, now c/o swelling to jaw. No hives, lungs clear per EMS

## 2015-03-27 NOTE — ED Provider Notes (Signed)
CSN: [id number]     Arrival date & time 03/27/15  0034 History   First MD Initiated Contact with Patient 03/27/15 [phone]     Chief Complaint  Patient presents with  . Allergic Reaction  . Sore Throat     (Consider location/radiation/quality/duration/timing/severity/associated sxs/prior Treatment) HPI   Tonya Summers is a 39 y.o. female with past medical history of GERD presenting today with neck pain. She was recently started on Reglan 2 days ago, she is taking a total of 5 tablets. Today she had a lot a greasy food as well. Subsequently she went to lay down and had pain in the muscles of her neck. She describes it as a tightening. She does not feel like her throat is closing, she denies any shortness of breath or difficulty breathing. She has no chest pain, rashes, or pruritus. She states her symptoms are similar to when she had bad reflux before. She is no longer on Zantac at the direction of her GI physician. She denies any fevers or recent infections. She had one episode of emesis and one episode of diarrhea today. She has no sick contacts.  10 Systems reviewed and are negative for acute change except as noted in the HPI.      Past Medical History  Diagnosis Date  . Asthma   . GERD (gastroesophageal reflux disease)    Past Surgical History  Procedure Laterality Date  . Cholecystectomy     History reviewed. No pertinent family history. History  Substance Use Topics  . Smoking status: Never Smoker   . Smokeless tobacco: Never Used  . Alcohol Use: No   OB History    No data available     Review of Systems    Allergies  Review of patient's allergies indicates no known allergies.  Home Medications   Prior to Admission medications   Medication Sig Start Date End Date Taking? Authorizing Provider  metoCLOPramide (REGLAN) 10 MG tablet Take 1 tablet (10 mg total) by mouth 4 (four) times daily -  before meals and at bedtime. 03/25/15  Yes Inda Castle, MD   BP 119/63  mmHg  Pulse 99  Temp(Src) 97.7 F (36.5 C) (Oral)  Resp 18  SpO2 100%  LMP 02/26/2015 Physical Exam  Constitutional: She is oriented to person, place, and time. She appears well-developed and well-nourished. No distress.  HENT:  Head: Normocephalic and atraumatic.  Nose: Nose normal.  Mouth/Throat: Oropharynx is clear and moist. No oropharyngeal exudate.  Eyes: Conjunctivae and EOM are normal. Pupils are equal, round, and reactive to light. No scleral icterus.  Neck: Normal range of motion. Neck supple. No JVD present. No tracheal deviation present. No thyromegaly present.  Cardiovascular: Normal rate, regular rhythm and normal heart sounds.  Exam reveals no gallop and no friction rub.   No murmur heard. Pulmonary/Chest: Effort normal and breath sounds normal. No respiratory distress. She has no wheezes. She exhibits no tenderness.  Abdominal: Soft. Bowel sounds are normal. She exhibits no distension and no mass. There is tenderness. There is no rebound and no guarding.  midepigastric TTP  Musculoskeletal: Normal range of motion. She exhibits no edema or tenderness.  Lymphadenopathy:    She has no cervical adenopathy.  Neurological: She is alert and oriented to person, place, and time. No cranial nerve deficit. She exhibits normal muscle tone.  Skin: Skin is warm and dry. No rash noted. No erythema. No pallor.  Nursing note and vitals reviewed.   ED Course  Procedures (including  critical care time) Labs Review Labs Reviewed - No data to display  Imaging Review No results found.   EKG Interpretation None      MDM   Final diagnoses:  Gastroesophageal reflux disease without esophagitis   patient presents emergency department for jaw pain after taking Reglan, eating a greasy meal and laying down flat. I do not believe this is an acute allergic reaction. Education was provided regarding allergic reactions and return precautions given. She is advised to decrease her Reglan to 3  times per day. She also will be restarted on Zantac. She states this helped with her reflux symptoms. She was given a dose in the emergency department. She does have midepigastric tenderness to palpation however patient states that her normal baseline. Patient overall appears well in no acute distress. Her vital signs were within her normal limits and she is safe for discharge. She is advised to see her GI physician within the next week for close follow-up.  Everlene Balls, MD 03/27/15 [phone]

## 2015-03-27 NOTE — ED Notes (Signed)
MD (Oni) at bedside. 

## 2015-03-29 ENCOUNTER — Ambulatory Visit (INDEPENDENT_AMBULATORY_CARE_PROVIDER_SITE_OTHER): Payer: Self-pay | Admitting: Family Medicine

## 2015-03-29 VITALS — BP 112/68 | HR 88 | Temp 98.9°F | Resp 18 | Ht 66.75 in | Wt 118.2 lb

## 2015-03-29 DIAGNOSIS — R110 Nausea: Secondary | ICD-10-CM

## 2015-03-29 DIAGNOSIS — N926 Irregular menstruation, unspecified: Secondary | ICD-10-CM

## 2015-03-29 DIAGNOSIS — R197 Diarrhea, unspecified: Secondary | ICD-10-CM

## 2015-03-29 DIAGNOSIS — Z1239 Encounter for other screening for malignant neoplasm of breast: Secondary | ICD-10-CM

## 2015-03-29 DIAGNOSIS — R634 Abnormal weight loss: Secondary | ICD-10-CM

## 2015-03-29 LAB — POCT URINALYSIS DIPSTICK
Bilirubin, UA: NEGATIVE
Glucose, UA: NEGATIVE
NITRITE UA: NEGATIVE
Protein, UA: NEGATIVE
SPEC GRAV UA: 1.025
Urobilinogen, UA: 0.2
pH, UA: 5.5

## 2015-03-29 LAB — POCT CBC
Granulocyte percent: 57 %G (ref 37–80)
HCT, POC: 33.5 % — AB (ref 37.7–47.9)
Hemoglobin: 10.5 g/dL — AB (ref 12.2–16.2)
LYMPH, POC: 1.5 (ref 0.6–3.4)
MCH: 25.7 pg — AB (ref 27–31.2)
MCHC: 31.2 g/dL — AB (ref 31.8–35.4)
MCV: 82.4 fL (ref 80–97)
MID (CBC): 0.2 (ref 0–0.9)
MPV: 9.2 fL (ref 0–99.8)
POC GRANULOCYTE: 2.2 (ref 2–6.9)
POC LYMPH %: 38 % (ref 10–50)
POC MID %: 5 %M (ref 0–12)
Platelet Count, POC: 234 10*3/uL (ref 142–424)
RBC: 4.07 M/uL (ref 4.04–5.48)
RDW, POC: 18.2 %
WBC: 3.9 10*3/uL — AB (ref 4.6–10.2)

## 2015-03-29 NOTE — Progress Notes (Signed)
Patient ID: Tonya Summers, female   DOB: 1976/09/20, 39 y.o.   MRN: [id number]   Subjective:  This chart was scribed for Tonya Forts, MD by [facility], medical scribe at [facility].The patient was seen in exam room 05 and the patient's care was started at 4:41 PM.   Patient ID: Tonya Summers, female    DOB: 1976-07-18, 39 y.o.   MRN: [id number]  03/29/2015  Follow-up  HPI HPI Comments: [name] is a 39 y.o. female who presents to [facility] here for a follow up. Dr. Deatra Ina is her GI doctor, has had a normal endoscopy 03/04/2015; pt was advised to stop her PPI and H2 Blocker after a normal EGD.  Had her gallbladder removed four months ago but no other surgeries.  Pt is s/p gastric emptying study on 03/23/15 per Dr. Deatra Ina that revealed a slightly delayed gastric emptying time at 4 hours; Dr. Deatra Ina started patient on Reglan 10mg  qac and qhs.     Today she is concerned about her recent weight loss. She has lost weight 40 pounds since 07/2015. Pt wants her thyroid checked, and wants to see if there are bacteria or parasites in her stool. Pt states she has nausea every morning but this improves throughout the day especially while taking Reglan. Pt has two bowel movements a day and does suffer with intermittent diarrhea three days per week on average. 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 She denies fever, chills, sweats, night sweats, headache, shortness of breath, cough, chest pain, vomiting, depression, night sweats and sore throat currently. Pt is from [place] and has been here for 9 months. No concerning family history. Five miscarriages, one child who is 40 years old.  +leg swelling started in the past six months; mild to moderate in severity.   Last pap smear reported in 2015 in [place].  No previous mammogram.  No colonoscopy.   Review of Systems  Constitutional: Positive for appetite change  and unexpected weight change. Negative for fever, chills, diaphoresis, activity change and fatigue.  HENT: Negative for congestion, ear pain, postnasal drip, rhinorrhea, sinus pressure, sore throat and trouble swallowing.   Respiratory: Negative for cough and shortness of breath.   Cardiovascular: Positive for leg swelling. Negative for chest pain and palpitations.  Gastrointestinal: Positive for nausea. Negative for vomiting, abdominal pain, diarrhea, constipation, blood in stool, abdominal distention, anal bleeding and rectal pain.  Genitourinary: Positive for menstrual problem.  Neurological: Negative for dizziness, weakness, numbness and headaches.  Psychiatric/Behavioral: Negative for suicidal ideas, sleep disturbance, self-injury and dysphoric mood. The patient is not nervous/anxious.     Past Medical History  Diagnosis Date  . Asthma   . GERD (gastroesophageal reflux disease)    Past Surgical History  Procedure Laterality Date  . Cholecystectomy     No Known Allergies Current Outpatient Prescriptions  Medication Sig Dispense Refill  . metoCLOPramide (REGLAN) 10 MG tablet Take 1 tablet (10 mg total) by mouth 4 (four) times daily -  before meals and at bedtime. 120 tablet 2   No current facility-administered medications for this visit.   History   Social History  . Marital Status: Married    Spouse Name: N/A  . Number of Children: 1  . Years of Education: N/A   Occupational History  . Not on file.   Social History Main Topics  . Smoking status: Never Smoker   . Smokeless tobacco:  Never Used  . Alcohol Use: No  . Drug Use: No  . Sexual Activity: Not on file   Other Topics Concern  . Not on file   Social History Narrative   Marital status: married      Children: 1 child (58)      Moved from [place] in 2016   History reviewed. No pertinent family history.      Objective:    BP 112/68 mmHg  Pulse 88  Temp(Src) 98.9 F (37.2 C) (Oral)  Resp 18  Ht 5' 6.75"  (1.695 m)  Wt 118 lb 3.2 oz (53.615 kg)  BMI 18.66 kg/m2  SpO2 99%  LMP 02/26/2015 Physical Exam  Constitutional: She is oriented to person, place, and time. She appears well-developed and well-nourished. No distress.  HENT:  Head: Normocephalic and atraumatic.  Right Ear: External ear normal.  Left Ear: External ear normal.  Nose: Nose normal.  Mouth/Throat: Oropharynx is clear and moist.  Eyes: Conjunctivae and EOM are normal. Pupils are equal, round, and reactive to light.  Neck: Normal range of motion. Neck supple. Carotid bruit is not present. No thyromegaly present.  Cardiovascular: Normal rate, regular rhythm, normal heart sounds and intact distal pulses.  Exam reveals no gallop and no friction rub.   No murmur heard. Pulmonary/Chest: Effort normal and breath sounds normal. She has no wheezes. She has no rales. Right breast exhibits no inverted nipple, no mass, no nipple discharge, no skin change and no tenderness. Left breast exhibits no inverted nipple, no mass, no nipple discharge, no skin change and no tenderness. Breasts are symmetrical.  Abdominal: Soft. Bowel sounds are normal. She exhibits no distension and no mass. There is no tenderness. There is no rebound and no guarding.  Lymphadenopathy:    She has no cervical adenopathy.  Neurological: She is alert and oriented to person, place, and time. No cranial nerve deficit.  Skin: Skin is warm and dry. No rash noted. She is not diaphoretic. No erythema. No pallor.  Psychiatric: She has a normal mood and affect. Her behavior is normal.   Results for orders placed or performed in visit on 03/29/15  POCT urinalysis dipstick  Result Value Ref Range   Color, UA yellow    Clarity, UA clear    Glucose, UA neg    Bilirubin, UA neg    Ketones, UA trace    Spec Grav, UA 1.025    Blood, UA small    pH, UA 5.5    Protein, UA neg    Urobilinogen, UA 0.2    Nitrite, UA neg    Leukocytes, UA Trace   POCT CBC  Result Value Ref  Range   WBC 3.9 (A) 4.6 - 10.2 K/uL   Lymph, poc 1.5 0.6 - 3.4   POC LYMPH PERCENT 38.0 10 - 50 %L   MID (cbc) 0.2 0 - 0.9   POC MID % 5.0 0 - 12 %M   POC Granulocyte 2.2 2 - 6.9   Granulocyte percent 57.0 37 - 80 %G   RBC 4.07 4.04 - 5.48 M/uL   Hemoglobin 10.5 (A) 12.2 - 16.2 g/dL   HCT, POC 33.5 (A) 37.7 - 47.9 %   MCV 82.4 80 - 97 fL   MCH, POC 25.7 (A) 27 - 31.2 pg   MCHC 31.2 (A) 31.8 - 35.4 g/dL   RDW, POC 18.2 %   Platelet Count, POC 234 142 - 424 K/uL   MPV 9.2 0 - 99.8 fL  Assessment & Plan:   1. Unintentional weight loss   2. Diarrhea   3. Irregular menses   4. Nausea without vomiting     1. Unintentional weight loss:  Worsening; upon review of EMR, weight down 43 pounds since 07/2014.  Obtain TSH, stool studies.  If labs normal; will warrant pap smear, mammogram.  If continues, will warrant CT chest/abd/pelvis and colonoscopy.  Pt denies depressive symptoms at this time. 2.  Diarrhea: New; mild and intermittent; agreeable to stool studies.  If stool studies negative, will warrant colonoscopy due to weight loss. 3. Nausea: improved with Reglan.  S/p EGD and gastric emptying study.  May warrant CT abd/pelvis due to weight loss. 4.  Irregular menses: onset in past month; five miscarriages in the past.  Obtain TSH.   No orders of the defined types were placed in this encounter.    No Follow-up on file.  I personally performed the services described in this documentation, which was scribed in my presence. The recorded information has been reviewed and considered.  Patsy Zaragoza Elayne Guerin, M.D. [facility] [address] [place], [place]  [address] [phone] phone [phone] fax

## 2015-03-30 LAB — COMPREHENSIVE METABOLIC PANEL
ALBUMIN: 3.9 g/dL (ref 3.5–5.2)
ALT: <8 U/L (ref 0–35)
AST: 10 U/L (ref 0–37)
Alkaline Phosphatase: 38 U/L — ABNORMAL LOW (ref 39–117)
BUN: 8 mg/dL (ref 6–23)
CALCIUM: 9.1 mg/dL (ref 8.4–10.5)
CHLORIDE: 104 meq/L (ref 96–112)
CO2: 27 mEq/L (ref 19–32)
Creat: 0.62 mg/dL (ref 0.50–1.10)
GLUCOSE: 110 mg/dL — AB (ref 70–99)
POTASSIUM: 4.5 meq/L (ref 3.5–5.3)
Sodium: 135 mEq/L (ref 135–145)
Total Bilirubin: 1 mg/dL (ref 0.2–1.2)
Total Protein: 6.5 g/dL (ref 6.0–8.3)

## 2015-03-30 LAB — TSH: TSH: 1.017 u[IU]/mL (ref 0.350–4.500)

## 2015-03-31 LAB — OVA AND PARASITE EXAMINATION: OP: NONE SEEN

## 2015-03-31 LAB — CLOSTRIDIUM DIFFICILE BY PCR: CDIFFPCR: NOT DETECTED

## 2015-04-03 LAB — STOOL CULTURE

## 2015-04-04 ENCOUNTER — Telehealth: Payer: Self-pay | Admitting: Radiology

## 2015-04-04 NOTE — Telephone Encounter (Signed)
Pt's husband is calling about lab results.

## 2015-04-05 NOTE — Addendum Note (Signed)
Addended by: Wardell Honour on: 04/05/2015 01:44 PM   Modules accepted: Orders

## 2015-04-05 NOTE — Telephone Encounter (Signed)
Lab results sent to Lab Pool to contact pt with results.

## 2015-04-09 ENCOUNTER — Other Ambulatory Visit: Payer: Self-pay

## 2015-04-09 ENCOUNTER — Telehealth: Payer: Self-pay | Admitting: Gastroenterology

## 2015-04-09 MED ORDER — ONDANSETRON HCL 4 MG PO TABS: 4.0000 mg | ORAL_TABLET | Freq: Three times a day (TID) | ORAL | Status: DC | PRN

## 2015-04-09 NOTE — Telephone Encounter (Signed)
Patient notified

## 2015-04-09 NOTE — Telephone Encounter (Signed)
Can I send her some Zofran? She is on Reglan. She has a follow up appointment in July.

## 2015-04-09 NOTE — Telephone Encounter (Signed)
Ok

## 2015-04-26 ENCOUNTER — Other Ambulatory Visit: Payer: Self-pay

## 2015-04-26 ENCOUNTER — Other Ambulatory Visit: Payer: Self-pay | Admitting: *Deleted

## 2015-04-26 DIAGNOSIS — K559 Vascular disorder of intestine, unspecified: Secondary | ICD-10-CM

## 2015-04-26 DIAGNOSIS — K551 Chronic vascular disorders of intestine: Secondary | ICD-10-CM

## 2015-04-30 ENCOUNTER — Ambulatory Visit
Admission: RE | Admit: 2015-04-30 | Discharge: 2015-04-30 | Disposition: A | Discharge status: Home / Self Care | Payer: Medicaid Other | Source: Ambulatory Visit | Attending: Surgery | Admitting: Surgery

## 2015-04-30 MED ORDER — IOPAMIDOL (ISOVUE-370) INJECTION 76%
75.0000 mL | Freq: Once | INTRAVENOUS | Status: AC | PRN
  Administered 2015-04-30: 75 mL via INTRAVENOUS

## 2015-05-05 ENCOUNTER — Other Ambulatory Visit: Payer: Self-pay

## 2015-05-05 DIAGNOSIS — N289 Disorder of kidney and ureter, unspecified: Secondary | ICD-10-CM

## 2015-05-05 DIAGNOSIS — N2889 Other specified disorders of kidney and ureter: Secondary | ICD-10-CM

## 2015-05-24 ENCOUNTER — Ambulatory Visit (INDEPENDENT_AMBULATORY_CARE_PROVIDER_SITE_OTHER): Payer: Medicaid Other | Admitting: Gastroenterology

## 2015-05-24 ENCOUNTER — Encounter: Payer: Self-pay | Admitting: Gastroenterology

## 2015-05-24 VITALS — BP 100/60 | HR 72 | Ht 66.75 in | Wt 110.4 lb

## 2015-05-24 DIAGNOSIS — K3184 Gastroparesis: Secondary | ICD-10-CM | POA: Insufficient documentation

## 2015-05-24 MED ORDER — AMBULATORY NON FORMULARY MEDICATION: Status: DC

## 2015-05-24 NOTE — Progress Notes (Signed)
      History of Present Illness:  Tonya Summers has returned for follow-up of gastroparesis.  This was diagnosed by gastric emptying scan.  She took Reglan for 2 days but developed a sore throat and was seen in the ED.  It was felt that symptoms were not related to Reglan but she was nonetheless told to discontinue this medication.  She complains of nausea, particularly in the mornings.  She has rare upper abdominal discomfort.    Review of Systems: Pertinent positive and negative review of systems were noted in the above HPI section. All other review of systems were otherwise negative.    Current Medications, Allergies, Past Medical History, Past Surgical History, Family History and Social History were reviewed in [facility] record  Vital signs were reviewed in today's medical record. Physical Exam: General: Well developed , well nourished, no acute distress   See Assessment and Plan under Problem List

## 2015-05-24 NOTE — Patient Instructions (Signed)
Gastroparesis  Gastroparesis is also called slowed stomach emptying (delayed gastric emptying). It is a condition in which the stomach takes too long to empty its contents. It often happens in people with diabetes.  CAUSES  Gastroparesis happens when nerves to the stomach are damaged or stop working. When the nerves are damaged, the muscles of the stomach and intestines do not work normally. The movement of food is slowed or stopped. High blood glucose (sugar) causes changes in nerves and can damage the blood vessels that carry oxygen and nutrients to the nerves. RISK FACTORS  Diabetes.  Post-viral syndromes.  Eating disorders (anorexia, bulimia).  Surgery on the stomach or vagus nerve.  Gastroesophageal reflux disease (rarely).  Smooth muscle disorders (amyloidosis, scleroderma).  Metabolic disorders, including hypothyroidism.  Parkinson disease. SYMPTOMS   Heartburn.  Feeling sick to your stomach (nausea).  Vomiting of undigested food.  An early feeling of fullness when eating.  Weight loss.  Abdominal bloating.  Erratic blood glucose levels.  Lack of appetite.  Gastroesophageal reflux.  Spasms of the stomach wall. Complications can include:  Bacterial overgrowth in stomach. Food stays in the stomach and can ferment and cause bacteria to grow.  Weight loss due to difficulty digesting and absorbing nutrients.  Vomiting.  Obstruction in the stomach. Undigested food can harden and cause nausea and vomiting.  Blood glucose fluctuations caused by inconsistent food absorption. DIAGNOSIS  The diagnosis of gastroparesis is confirmed through one or more of the following tests:  Barium X-rays and scans. These tests look at how long it takes for food to move through the stomach.  Gastric manometry. This test measures electrical and muscular activity in the stomach. A thin tube is passed down the throat into the stomach. The tube contains a wire that takes measurements  of the stomach's electrical and muscular activity as it digests liquids and solid food.  Endoscopy. This procedure is done with a long, thin tube called an endoscope. It is passed through the mouth and gently down the esophagus into the stomach. This tube helps the caregiver look at the lining of the stomach to check for any abnormalities.  Ultrasonography. This can rule out gallbladder disease or pancreatitis. This test will outline and define the shape of the gallbladder and pancreas. TREATMENT   Treatments may include:  Exercise.  Medicines to control nausea and vomiting.  Medicines to stimulate stomach muscles.  Changes in what and when you eat.  Having smaller meals more often.  Eating low-fiber forms of high-fiber foods, such as eating cooked vegetables instead of raw vegetables.  Eating low-fat foods.  Consuming liquids, which are easier to digest.  In severe cases, feeding tubes and intravenous (IV) feeding may be needed. It is important to note that in most cases, treatment does not cure gastroparesis. It is usually a lasting (chronic) condition. Treatment helps you manage the underlying condition so that you can be as healthy and comfortable as possible. Other treatments  A gastric neurostimulator has been developed to assist people with gastroparesis. The battery-operated device is surgically implanted. It emits mild electrical pulses to help improve stomach emptying and to control nausea and vomiting.  The use of botulinum toxin has been shown to improve stomach emptying by decreasing the prolonged contractions of the muscle between the stomach and the small intestine (pyloric sphincter). The benefits are temporary. SEEK MEDICAL CARE IF:   You have diabetes and you are having problems keeping your blood glucose in goal range.  You are having nausea,  vomiting, bloating, or early feelings of fullness with eating.  Your symptoms do not change with a change in  diet. Document Released: 10/16/2005 Document Revised: 02/10/2013 Document Reviewed: 03/25/2009 [facility] Patient Information 2015 [place], [place]. This information is not intended to replace advice given to you by your health care provider. Make sure you discuss any questions you have with your health care provider.  Research will be speaking with you today

## 2015-05-24 NOTE — Assessment & Plan Note (Addendum)
The patient has symptoms referable to gastroparesis.  She took only 2 days of Reglan but it appears that she is not allergic to Reglan.  Nonetheless, she is very hesitant to resume this medication.  Recommendations #1 patient will consider enrollment in a gastroparesis trial.  In the interim, will try her on domperidone #2 gastroparesis diet

## 2015-05-29 ENCOUNTER — Ambulatory Visit (INDEPENDENT_AMBULATORY_CARE_PROVIDER_SITE_OTHER): Payer: Self-pay | Admitting: Family Medicine

## 2015-05-29 VITALS — BP 122/78 | HR 95 | Temp 98.4°F | Resp 16 | Ht 66.0 in | Wt 110.0 lb

## 2015-05-29 DIAGNOSIS — F329 Major depressive disorder, single episode, unspecified: Secondary | ICD-10-CM

## 2015-05-29 DIAGNOSIS — F4321 Adjustment disorder with depressed mood: Secondary | ICD-10-CM

## 2015-05-29 DIAGNOSIS — F32A Depression, unspecified: Secondary | ICD-10-CM

## 2015-05-29 DIAGNOSIS — K3184 Gastroparesis: Secondary | ICD-10-CM

## 2015-05-29 DIAGNOSIS — N926 Irregular menstruation, unspecified: Secondary | ICD-10-CM

## 2015-05-29 DIAGNOSIS — R911 Solitary pulmonary nodule: Secondary | ICD-10-CM

## 2015-05-29 DIAGNOSIS — F418 Other specified anxiety disorders: Secondary | ICD-10-CM

## 2015-05-29 DIAGNOSIS — F4329 Adjustment disorder with other symptoms: Secondary | ICD-10-CM

## 2015-05-29 DIAGNOSIS — F4381 Prolonged grief disorder: Secondary | ICD-10-CM

## 2015-05-29 DIAGNOSIS — R110 Nausea: Secondary | ICD-10-CM

## 2015-05-29 DIAGNOSIS — N2889 Other specified disorders of kidney and ureter: Secondary | ICD-10-CM

## 2015-05-29 DIAGNOSIS — R634 Abnormal weight loss: Secondary | ICD-10-CM

## 2015-05-29 DIAGNOSIS — F419 Anxiety disorder, unspecified: Secondary | ICD-10-CM

## 2015-05-29 DIAGNOSIS — R319 Hematuria, unspecified: Secondary | ICD-10-CM

## 2015-05-29 LAB — POCT URINALYSIS DIPSTICK
BILIRUBIN UA: NEGATIVE
GLUCOSE UA: NEGATIVE
KETONES UA: NEGATIVE
Nitrite, UA: NEGATIVE
PH UA: 6.5
Protein, UA: 30
SPEC GRAV UA: 1.015
Urobilinogen, UA: 0.2

## 2015-05-29 LAB — POCT UA - MICROSCOPIC ONLY
CASTS, UR, LPF, POC: NEGATIVE
Crystals, Ur, HPF, POC: NEGATIVE
Mucus, UA: NEGATIVE
YEAST UA: NEGATIVE

## 2015-05-29 LAB — POCT URINE PREGNANCY: Preg Test, Ur: NEGATIVE

## 2015-05-29 NOTE — Progress Notes (Signed)
Subjective:    Patient ID: Tonya Summers, female    DOB: 1976/08/10, 39 y.o.   MRN: [id number]  05/29/2015  FOLLOW-UP labs   HPI This 39 y.o. female presents for evaluation of unintentional weight loss.  51 pound documented weight loss since 08/11/2014.  Please see EPIC flow sheet for details.  PCP on Medicaid card: [facility]   Stool culture/C Difficile/O&P studies negative.  04-14-15 TSH 04-14-15 WNL 1.017 CBC with Hgb of 10.5; irregular menses. CMET WNL. U/a small blood; no urine micro obtained.  April 14, 2015 EGD 5/5/ 2016 for nausea obtained; negative.  CT angio abdomen/pelvis results on 04/30/2015: IMPRESSION: Normal appearance of the abdominal aorta, visceral arteries and iliac arteries. No evidence for atherosclerotic plaque or stenosis.  There is a 1.3 cm low-density structure in the left kidney upper pole. This could represent a mildly complex cyst but indeterminate. This could be more definitively characterized with a pre and post contrast MRI.  No acute abnormality in the abdomen or pelvis.  2 mm punctate nodular density at the left lung base is indeterminate. If the patient is at high risk for bronchogenic carcinoma, follow-up chest CT at 1 year is recommended. If the patient is at low risk, no follow-up is needed. This recommendation follows the consensus statement: Guidelines for Management of Small Pulmonary Nodules Detected on CT Scans: A Statement from the [place] as published in Radiology 2005; 237:395-400.  Last pap smear in [place] in 05/2014; mammogram never.  Colonoscopy never.  Medicaid would not approve MRI kidney.  No dedicated CT chest.    LMP 05-16-2015; duration 7 days; heavy 3 days.  No OB/GYN.    S/p cholecystectomy 6 months ago.    No fever/chills/sweats.  Nausea every morning; resolves after eating; duration 2 hours every morning.  No vomiting.  Having 1-2 bowel movements per day; some loose stools;  yellow.  Non-bloody stools.  No abdominal pain.  +epigastric pain.  B flank pain.     Food: 2% milk, fruit, chicken soup.  Bananas, apples, fresh fruit drink.  Has avoided greasy and high fat foods for ten months and GERD.  Sleeps good.  Brother passed away very young; etiology unclear. Worries excessively.  Brother was in [place].  Last year before moving here.  Mind races.  Review of Systems  Constitutional: Positive for unexpected weight change. Negative for fever, chills, diaphoresis, appetite change and fatigue.  Eyes: Negative for visual disturbance.  Respiratory: Negative for cough and shortness of breath.   Cardiovascular: Negative for chest pain, palpitations and leg swelling.  Gastrointestinal: Negative for nausea, vomiting, abdominal pain, diarrhea, constipation, blood in stool, abdominal distention, anal bleeding and rectal pain.  Endocrine: Negative for cold intolerance, heat intolerance, polydipsia, polyphagia and polyuria.  Genitourinary: Positive for flank pain. Negative for dysuria, frequency, hematuria, vaginal discharge and vaginal pain.  Musculoskeletal: Positive for myalgias and back pain. Negative for arthralgias and neck pain.  Skin: Negative for color change, rash and wound.  Neurological: Negative for dizziness, tremors, seizures, syncope, facial asymmetry, speech difficulty, weakness, light-headedness, numbness and headaches.  Psychiatric/Behavioral: Positive for dysphoric mood. Negative for suicidal ideas, sleep disturbance and self-injury. The patient is nervous/anxious.     Past Medical History  Diagnosis Date  . Asthma   . GERD (gastroesophageal reflux disease)    Past Surgical History  Procedure Laterality Date  . Cholecystectomy     No Known Allergies Current Outpatient Prescriptions  Medication Sig Dispense Refill  . ranitidine (  ZANTAC) 75 MG tablet Take 75 mg by mouth 2 (two) times daily.    . AMBULATORY NON FORMULARY MEDICATION Medication Name:  I tablet by mouth four times a day 30 minutes before breakfast and at bedtime (Patient not taking: Reported on 05/29/2015) 100 capsule 2  . metoCLOPramide (REGLAN) 10 MG tablet Take 1 tablet (10 mg total) by mouth 4 (four) times daily -  before meals and at bedtime. (Patient not taking: Reported on 05/24/2015) 120 tablet 2  . ondansetron (ZOFRAN) 4 MG tablet Take 1 tablet (4 mg total) by mouth every 8 (eight) hours as needed for nausea or vomiting. (Patient not taking: Reported on 05/24/2015) 30 tablet 0   No current facility-administered medications for this visit.   History   Social History  . Marital Status: Married    Spouse Name: N/A  . Number of Children: 1  . Years of Education: N/A   Occupational History  . Not on file.   Social History Main Topics  . Smoking status: Never Smoker   . Smokeless tobacco: Never Used  . Alcohol Use: No  . Drug Use: No  . Sexual Activity: Not on file   Other Topics Concern  . Not on file   Social History Narrative   Marital status: married      Children: 1 child (47)      Moved from [place] in 2016   History reviewed. No pertinent family history.      Objective:    BP 122/78 mmHg  Pulse 95  Temp(Src) 98.4 F (36.9 C) (Oral)  Resp 16  Ht 5\' 6"  (1.676 m)  Wt 110 lb (49.896 kg)  BMI 17.76 kg/m2  SpO2 99%  LMP 05/16/2015 (Exact Date) Physical Exam  Constitutional: She is oriented to person, place, and time. She appears well-developed and well-nourished. No distress.  HENT:  Head: Normocephalic and atraumatic.  Eyes: Conjunctivae and EOM are normal. Pupils are equal, round, and reactive to light.  Neck: Normal range of motion. Neck supple. Carotid bruit is not present.  Cardiovascular: Normal rate, regular rhythm, normal heart sounds and intact distal pulses.  Exam reveals no gallop and no friction rub.   No murmur heard. Pulmonary/Chest: Effort normal and breath sounds normal. She has no wheezes. She has no rales.  Abdominal: Soft.  Bowel sounds are normal. She exhibits no distension and no mass. There is no tenderness. There is no rebound and no guarding.  Musculoskeletal:       Thoracic back: She exhibits tenderness and pain. She exhibits normal range of motion, no bony tenderness and no spasm.       Lumbar back: Normal. She exhibits normal range of motion, no tenderness, no bony tenderness and no pain.  +TTP B thoracic region along posterior ribs.  Neurological: She is alert and oriented to person, place, and time. No cranial nerve deficit.  Skin: Skin is warm and dry. No rash noted. She is not diaphoretic. No erythema. No pallor.  Psychiatric: She has a normal mood and affect. Her behavior is normal. Judgment and thought content normal.   Results for orders placed or performed in visit on 05/29/15  POCT urine pregnancy  Result Value Ref Range   Preg Test, Ur Negative Negative  POCT urinalysis dipstick  Result Value Ref Range   Color, UA yellow    Clarity, UA clear    Glucose, UA neg    Bilirubin, UA neg    Ketones, UA neg    Spec Grav,  UA 1.015    Blood, UA trace-lysed    pH, UA 6.5    Protein, UA 30    Urobilinogen, UA 0.2    Nitrite, UA neg    Leukocytes, UA Trace (A) Negative  POCT UA - Microscopic Only  Result Value Ref Range   WBC, Ur, HPF, POC 3-5    RBC, urine, microscopic 0-1    Bacteria, U Microscopic trace    Mucus, UA neg    Epithelial cells, urine per micros 2-4    Crystals, Ur, HPF, POC neg    Casts, Ur, LPF, POC neg    Yeast, UA neg        Assessment & Plan:   1. Nausea without vomiting   2. Unintentional weight loss   3. Hematuria   4. Gastroparesis   5. Renal mass, left   6. Incidental lung nodule, less than or equal to 37mm   7. Irregular menses   8. Prolonged grief reaction   9. Anxiety and depression    1. Nausea without vomiting: persistent; occurs every morning; duration 1-2 hours and spontaneously resolves.  S/p EGD.  S/p gastric emptying scan with slight delay in  gastric emptying.  Refer for second opinion. 2.  Unintentional weight loss: worsening; weight down 51 pounds in past eight months; revealed that has been on a very low fat diet for the past nine months.  S/p EGD, CT abd/pelvis.  Refer for CT chest and mammogram. Pt declined pap smear today; agreeable to performing at next visit.  Recommend eating ice cream daily and peanut butter daily. 3.  Gastroparesis: Mild/slight; recommend dietary modification only.  Try to avoid medication at this time. 4.  L renal mass: New.  U/a negative.  No further work up warranted at this time. 5.  Lung nodule: refer for CT chest due to weight loss. 6.  Prolonged grieving reaction: New.  Revealed today that brother died suddenly of unknown causes last year; has been suffering with depression and anxiety; pt declined medication at this time; good family support;likely contributing to weight loss. 7.  Irregular menses: negative urine pregnancy; has suffered 5 miscarriages. 8. Anxiety and depression: New.  Pt declined pharmacotherapy.    No orders of the defined types were placed in this encounter.    No Follow-up on file.    Shalicia Craghead Elayne Guerin, M.D. [facility] [address] [place], [place]  [address] [phone] phone [phone] fax

## 2015-05-29 NOTE — Patient Instructions (Addendum)
1. Recommend having ice cream daily for the next month. 2. Recommend peanut butter daily for the next month. 3. Return in one month for weight check. 4. There is no blood in the urine.

## 2015-07-02 ENCOUNTER — Other Ambulatory Visit: Payer: Self-pay | Admitting: Obstetrics

## 2015-07-02 ENCOUNTER — Ambulatory Visit: Payer: Self-pay | Admitting: Family Medicine

## 2015-07-02 ENCOUNTER — Ambulatory Visit (INDEPENDENT_AMBULATORY_CARE_PROVIDER_SITE_OTHER): Payer: Medicaid Other | Admitting: Certified Nurse Midwife

## 2015-07-02 ENCOUNTER — Encounter: Payer: Self-pay | Admitting: Certified Nurse Midwife

## 2015-07-02 VITALS — BP 113/73 | HR 94 | Temp 98.5°F | Ht 69.0 in | Wt 114.0 lb

## 2015-07-02 DIAGNOSIS — N907 Vulvar cyst: Secondary | ICD-10-CM

## 2015-07-02 DIAGNOSIS — Z9189 Other specified personal risk factors, not elsewhere classified: Secondary | ICD-10-CM | POA: Diagnosis not present

## 2015-07-02 DIAGNOSIS — N9089 Other specified noninflammatory disorders of vulva and perineum: Secondary | ICD-10-CM

## 2015-07-02 DIAGNOSIS — N90810 Female genital mutilation status, unspecified: Secondary | ICD-10-CM | POA: Diagnosis not present

## 2015-07-02 DIAGNOSIS — Z1389 Encounter for screening for other disorder: Secondary | ICD-10-CM

## 2015-07-02 MED ORDER — AMOXICILLIN-POT CLAVULANATE 875-125 MG PO TABS: 1.0000 | ORAL_TABLET | Freq: Two times a day (BID) | ORAL | Status: DC

## 2015-07-02 NOTE — Progress Notes (Signed)
Patient ID: Tremaine Fuhriman, female   DOB: 1976-03-06, 39 y.o.   MRN: [id number]   Chief Complaint  Patient presents with  . Personal Problem    cyst    HPI Jackolyn Geron is a 39 y.o. female.  Here with c/o vaginal discharge, boil/cyst for the past 3 weeks.  States that it is very painful.  Denies any odor or itching.  Has not had sexual intercourse.  Needs an annual exam.   Spouse present for exam/procedure.   HPI  Past Medical History  Diagnosis Date  . Asthma   . GERD (gastroesophageal reflux disease)     Past Surgical History  Procedure Laterality Date  . Cholecystectomy      History reviewed. No pertinent family history.  Social History Social History  Substance Use Topics  . Smoking status: Never Smoker   . Smokeless tobacco: Never Used  . Alcohol Use: No    No Known Allergies  Current Outpatient Prescriptions  Medication Sig Dispense Refill  . ranitidine (ZANTAC) 75 MG tablet Take 75 mg by mouth 2 (two) times daily.    . AMBULATORY NON FORMULARY MEDICATION Medication Name: I tablet by mouth four times a day 30 minutes before breakfast and at bedtime (Patient not taking: Reported on 05/29/2015) 100 capsule 2  . amoxicillin-clavulanate (AUGMENTIN) 875-125 MG per tablet Take 1 tablet by mouth 2 (two) times daily. 14 tablet 0  . metoCLOPramide (REGLAN) 10 MG tablet Take 1 tablet (10 mg total) by mouth 4 (four) times daily -  before meals and at bedtime. (Patient not taking: Reported on 05/24/2015) 120 tablet 2  . ondansetron (ZOFRAN) 4 MG tablet Take 1 tablet (4 mg total) by mouth every 8 (eight) hours as needed for nausea or vomiting. (Patient not taking: Reported on 05/24/2015) 30 tablet 0   No current facility-administered medications for this visit.    Review of Systems Review of Systems Constitutional: negative for fatigue and weight loss Respiratory: negative for cough and wheezing Cardiovascular: negative for chest pain, fatigue and palpitations Gastrointestinal:  negative for abdominal pain and change in bowel habits Genitourinary:negative Integument/breast: negative for nipple discharge Musculoskeletal:negative for myalgias Neurological: negative for gait problems and tremors Behavioral/Psych: negative for abusive relationship, depression Endocrine: negative for temperature intolerance     Blood pressure 113/73, pulse 94, temperature 98.5 F (36.9 C), height 5\' 9"  (1.753 m), weight 114 lb (51.71 kg), last menstrual period 06/15/2015.  Physical Exam Physical Exam General:   alert  Skin:   no rash or abnormalities  Lungs:   clear to auscultation bilaterally  Heart:   regular rate and rhythm, S1, S2 normal, no murmur, click, rub or gallop  Breasts:   deferred  Abdomen:  normal findings: no organomegaly, soft, non-tender and no hernia  Pelvis:  External genitalia: female circumcision hx.  Urinary system: urethral meatus normal and bladder without fullness, nontender Vaginal: normal without tenderness, or induration.Marland Kitchen + skin tag about 5 mm with stalk on left lateral vaginal wall about 1-2 cm inside Cervix: normal appearance Adnexa: normal bimanual exam Uterus: anteverted and non-tender, normal size    Procedure: using sterile technique cleaned vaginal area with skin tag.  Numbed the area with lidocaine.  Used sterile scissors after numbing the area to remove the skin tag.  Removed without difficulty and in one piece.  Tissue sent for pathology, appears normal.  No bleeding post procedure.  Patient tolerated well.    50% of 30 min visit spent on counseling and coordination of care.  Data Reviewed Previous medical hx, labs, meds  Assessment     Vaginal skin tag removal    Plan    Orders Placed This Encounter  Procedures  . Ambulatory referral to Dermatology    Referral Priority:  Routine    Referral Type:  Consultation    Referral Reason:  Specialty Services Required    Requested Specialty:  Dermatology    Number of Visits Requested:   1   Meds ordered this encounter  Medications  . amoxicillin-clavulanate (AUGMENTIN) 875-125 MG per tablet    Sig: Take 1 tablet by mouth 2 (two) times daily.    Dispense:  14 tablet    Refill:  0     Follow up in 2 weeks with annual exam.

## 2015-07-13 ENCOUNTER — Telehealth: Payer: Self-pay

## 2015-07-13 NOTE — Telephone Encounter (Signed)
patient's spouse stated would call [facility] for appt with dermatologist, Dr. Daine Floras, they received a call from [facility], but did not call them back for appt.

## 2015-07-16 ENCOUNTER — Telehealth: Payer: Self-pay

## 2015-07-16 ENCOUNTER — Ambulatory Visit: Payer: Medicaid Other | Admitting: Certified Nurse Midwife

## 2015-07-16 NOTE — Telephone Encounter (Signed)
spoke with patient's husband, gave appt date and time for dermatology appt with Dr. Mickel Fuchs on 07/26/15 at 11:30am

## 2015-07-23 ENCOUNTER — Encounter: Payer: Self-pay | Admitting: Certified Nurse Midwife

## 2015-07-23 ENCOUNTER — Telehealth: Payer: Self-pay

## 2015-07-23 ENCOUNTER — Ambulatory Visit (INDEPENDENT_AMBULATORY_CARE_PROVIDER_SITE_OTHER): Payer: Medicaid Other | Admitting: Certified Nurse Midwife

## 2015-07-23 VITALS — BP 112/74 | HR 95 | Temp 98.2°F | Ht 68.0 in | Wt 111.8 lb

## 2015-07-23 DIAGNOSIS — Z01419 Encounter for gynecological examination (general) (routine) without abnormal findings: Secondary | ICD-10-CM

## 2015-07-23 DIAGNOSIS — K219 Gastro-esophageal reflux disease without esophagitis: Secondary | ICD-10-CM

## 2015-07-23 DIAGNOSIS — Z0000 Encounter for general adult medical examination without abnormal findings: Secondary | ICD-10-CM

## 2015-07-23 MED ORDER — OMEPRAZOLE 20 MG PO TBEC: 1.0000 | DELAYED_RELEASE_TABLET | Freq: Every day | ORAL | Status: DC

## 2015-07-23 MED ORDER — CITRANATAL HARMONY 30-1-260 MG PO CAPS: 1.0000 | ORAL_CAPSULE | Freq: Every day | ORAL | Status: DC

## 2015-07-23 NOTE — Progress Notes (Signed)
Patient ID: Tonya Summers, female   DOB: April 25, 1976, 39 y.o.   MRN: [id number]    Subjective:        Tonya Summers is a 39 y.o. female here for a routine exam.  Current complaints: discharge with odor, for several weeks with lower abdominal pain.  Monthly periods, lasting about 7 days with menorrhagia for 3-4 days, denies clots, or cramping with cycles.      Personal health questionnaire:  Is patient [name], have a family history of breast and/or ovarian cancer: no Is there a family history of uterine cancer diagnosed at age < 80, gastrointestinal cancer, urinary tract cancer, family member who is a Field seismologist syndrome-associated carrier: no Is the patient overweight and hypertensive, family history of diabetes, personal history of gestational diabetes, preeclampsia or PCOS: no Is patient over 14, have PCOS,  family history of premature CHD under age 67, diabetes, smoke, have hypertension or peripheral artery disease:  no At any time, has a partner hit, kicked or otherwise hurt or frightened you?: no Over the past 2 weeks, have you felt down, depressed or hopeless?: no Over the past 2 weeks, have you felt little interest or pleasure in doing things?:no   Gynecologic History Patient's last menstrual period was 07/10/2015. Contraception: none, Natural family planing.   Last Pap: unknown. Results were: normal according to patient Last mammogram: none.   Obstetric History OB History  No data available    Past Medical History  Diagnosis Date  . Asthma   . GERD (gastroesophageal reflux disease)     Past Surgical History  Procedure Laterality Date  . Cholecystectomy       Current outpatient prescriptions:  .  AMBULATORY NON FORMULARY MEDICATION, Medication Name: I tablet by mouth four times a day 30 minutes before breakfast and at bedtime (Patient not taking: Reported on 05/29/2015), Disp: 100 capsule, Rfl: 2 .  amoxicillin-clavulanate (AUGMENTIN) 875-125 MG per tablet, Take 1  tablet by mouth 2 (two) times daily. (Patient not taking: Reported on 07/23/2015), Disp: 14 tablet, Rfl: 0 .  metoCLOPramide (REGLAN) 10 MG tablet, Take 1 tablet (10 mg total) by mouth 4 (four) times daily -  before meals and at bedtime. (Patient not taking: Reported on 05/24/2015), Disp: 120 tablet, Rfl: 2 .  Omeprazole (TGT OMEPRAZOLE) 20 MG TBEC, Take 1 tablet (20 mg total) by mouth daily., Disp: 60 each, Rfl: 12 .  ondansetron (ZOFRAN) 4 MG tablet, Take 1 tablet (4 mg total) by mouth every 8 (eight) hours as needed for nausea or vomiting. (Patient not taking: Reported on 05/24/2015), Disp: 30 tablet, Rfl: 0 .  Prenat w/o A-FeCbn-DSS-FA-DHA (CITRANATAL HARMONY) 30-1-260 MG CAPS, Take 1 tablet by mouth daily., Disp: 30 capsule, Rfl: 12 .  ranitidine (ZANTAC) 75 MG tablet, Take 75 mg by mouth 2 (two) times daily., Disp: , Rfl:  No Known Allergies  Social History  Substance Use Topics  . Smoking status: Never Smoker   . Smokeless tobacco: Never Used  . Alcohol Use: No    History reviewed. No pertinent family history.    Review of Systems  Constitutional: negative for fatigue and weight loss Respiratory: negative for cough and wheezing Cardiovascular: negative for chest pain, fatigue and palpitations Gastrointestinal: negative for abdominal pain and change in bowel habits Musculoskeletal:negative for myalgias Neurological: negative for gait problems and tremors Behavioral/Psych: negative for abusive relationship, depression Endocrine: negative for temperature intolerance   Genitourinary:negative for abnormal menstrual periods, genital lesions, hot flashes, sexual problems and vaginal discharge Integument/breast:  negative for breast lump, breast tenderness, nipple discharge and skin lesion(s)    Objective:       BP 112/74 mmHg  Pulse 95  Temp(Src) 98.2 F (36.8 C)  Ht 5\' 8"  (1.727 m)  Wt 111 lb 12.8 oz (50.712 kg)  BMI 17.00 kg/m2  LMP 07/10/2015 General:   alert  Skin:   no rash  or abnormalities  Lungs:   clear to auscultation bilaterally  Heart:   regular rate and rhythm, S1, S2 normal, no murmur, click, rub or gallop  Breasts:   normal without suspicious masses, skin or nipple changes or axillary nodes  Abdomen:  normal findings: no organomegaly, soft, non-tender and no hernia  Pelvis:  External genitalia: normal general appearance Urinary system: urethral meatus normal and bladder without fullness, nontender Vaginal: normal without tenderness, induration or masses Cervix: normal appearance Adnexa: normal bimanual exam Uterus: anteverted and non-tender, normal size   Lab Review Urine pregnancy test Labs reviewed yes Radiologic studies reviewed yes  50% of 30 min visit spent on counseling and coordination of care.   Assessment:    Healthy female exam.   Anorexia  Plan:    Education reviewed: calcium supplements, depression evaluation, low fat, low cholesterol diet, safe sex/STD prevention, self breast exams, skin cancer screening, weight bearing exercise and increased caloric intake.. Contraception: Natural family planning methods. Mammogram ordered. Follow up in: 1 year.   Meds ordered this encounter  Medications  . Omeprazole (TGT OMEPRAZOLE) 20 MG TBEC    Sig: Take 1 tablet (20 mg total) by mouth daily.    Dispense:  60 each    Refill:  12  . Prenat w/o A-FeCbn-DSS-FA-DHA (CITRANATAL HARMONY) 30-1-260 MG CAPS    Sig: Take 1 tablet by mouth daily.    Dispense:  30 capsule    Refill:  12   Orders Placed This Encounter  Procedures  . SureSwab, Vaginosis/Vaginitis Plus  . MM DIGITAL SCREENING BILATERAL    NO PROB/NO HX OF BR CA/ NO IMPLANTS PF NONE/NO NEEDS/GMD/BARB [phone]    Standing Status: Future     Number of Occurrences:      Standing Expiration Date: 09/21/2016    Order Specific Question:  Reason for Exam (SYMPTOM  OR DIAGNOSIS REQUIRED)    Answer:  annual exam    Order Specific Question:  Is the patient pregnant?     Answer:  No    Order Specific Question:  Preferred imaging location?    Answer:  [facility]

## 2015-07-23 NOTE — Telephone Encounter (Signed)
PATIENT HAS APPT WITH [facility] ON 07/30/15 AT 3:20PM, ALSO HAD APPT WITH [facility] ON 10/25 AT 9:30AM - THEY HAVE BOTH  APPTS

## 2015-07-27 LAB — PAP, TP IMAGING W/ HPV RNA, RFLX HPV TYPE 16,18/45: HPV MRNA, HIGH RISK: NOT DETECTED

## 2015-07-29 ENCOUNTER — Other Ambulatory Visit: Payer: Self-pay | Admitting: Certified Nurse Midwife

## 2015-07-29 DIAGNOSIS — B373 Candidiasis of vulva and vagina: Secondary | ICD-10-CM

## 2015-07-29 DIAGNOSIS — B3731 Acute candidiasis of vulva and vagina: Secondary | ICD-10-CM

## 2015-07-29 LAB — SURESWAB, VAGINOSIS/VAGINITIS PLUS
Atopobium vaginae: NOT DETECTED Log (cells/mL)
C. PARAPSILOSIS, DNA: NOT DETECTED
C. TROPICALIS, DNA: NOT DETECTED
C. albicans, DNA: NOT DETECTED
C. glabrata, DNA: DETECTED — AB
C. trachomatis RNA, TMA: NOT DETECTED
Gardnerella vaginalis: NOT DETECTED Log (cells/mL)
LACTOBACILLUS SPECIES: 7.1 Log (cells/mL)
MEGASPHAERA SPECIES: NOT DETECTED Log (cells/mL)
N. GONORRHOEAE RNA, TMA: NOT DETECTED
T. vaginalis RNA, QL TMA: NOT DETECTED

## 2015-07-29 MED ORDER — TERCONAZOLE 0.4 % VA CREA: 1.0000 | TOPICAL_CREAM | Freq: Every day | VAGINAL | Status: DC

## 2015-07-29 MED ORDER — FLUCONAZOLE 100 MG PO TABS: 100.0000 mg | ORAL_TABLET | Freq: Once | ORAL | Status: DC

## 2015-07-30 ENCOUNTER — Ambulatory Visit
Admission: RE | Admit: 2015-07-30 | Discharge: 2015-07-30 | Disposition: A | Discharge status: Home / Self Care | Payer: Medicaid Other | Source: Ambulatory Visit | Attending: Certified Nurse Midwife | Admitting: Certified Nurse Midwife

## 2015-07-30 DIAGNOSIS — Z01419 Encounter for gynecological examination (general) (routine) without abnormal findings: Secondary | ICD-10-CM

## 2015-07-31 HISTORY — PX: BREAST BIOPSY: SHX20

## 2015-08-04 ENCOUNTER — Other Ambulatory Visit: Payer: Self-pay | Admitting: Certified Nurse Midwife

## 2015-08-04 DIAGNOSIS — R928 Other abnormal and inconclusive findings on diagnostic imaging of breast: Secondary | ICD-10-CM

## 2015-08-13 ENCOUNTER — Other Ambulatory Visit: Payer: Self-pay | Admitting: Certified Nurse Midwife

## 2015-08-13 ENCOUNTER — Ambulatory Visit
Admission: RE | Admit: 2015-08-13 | Discharge: 2015-08-13 | Disposition: A | Discharge status: Home / Self Care | Payer: Medicaid Other | Source: Ambulatory Visit | Attending: Certified Nurse Midwife | Admitting: Certified Nurse Midwife

## 2015-08-13 DIAGNOSIS — R928 Other abnormal and inconclusive findings on diagnostic imaging of breast: Secondary | ICD-10-CM

## 2015-08-16 ENCOUNTER — Ambulatory Visit
Admission: RE | Admit: 2015-08-16 | Discharge: 2015-08-16 | Disposition: A | Discharge status: Home / Self Care | Payer: Medicaid Other | Source: Ambulatory Visit | Attending: Certified Nurse Midwife | Admitting: Certified Nurse Midwife

## 2015-08-16 ENCOUNTER — Other Ambulatory Visit: Payer: Self-pay | Admitting: Certified Nurse Midwife

## 2015-08-16 DIAGNOSIS — R928 Other abnormal and inconclusive findings on diagnostic imaging of breast: Secondary | ICD-10-CM

## 2015-09-13 ENCOUNTER — Other Ambulatory Visit: Payer: Self-pay | Admitting: General Surgery

## 2015-09-13 DIAGNOSIS — R928 Other abnormal and inconclusive findings on diagnostic imaging of breast: Secondary | ICD-10-CM

## 2015-10-18 ENCOUNTER — Telehealth: Payer: Self-pay | Admitting: Gastroenterology

## 2015-10-18 NOTE — Telephone Encounter (Signed)
This patient's husband calls for her. Patient is taking Zantac for her GI issues. She declines to use Reglan or Domperidone. She report Prilosec does not work. She is careful with her diet, but continues to have days of gastric pain. Appointment made for evaluation. She will need to establish with Dr Silverio Decamp also.

## 2015-10-19 ENCOUNTER — Ambulatory Visit (INDEPENDENT_AMBULATORY_CARE_PROVIDER_SITE_OTHER): Payer: Medicaid Other | Admitting: Physician Assistant

## 2015-10-19 ENCOUNTER — Encounter: Payer: Self-pay | Admitting: Physician Assistant

## 2015-10-19 ENCOUNTER — Other Ambulatory Visit (INDEPENDENT_AMBULATORY_CARE_PROVIDER_SITE_OTHER): Payer: Medicaid Other

## 2015-10-19 VITALS — BP 104/68 | HR 100 | Ht 66.75 in | Wt 108.4 lb

## 2015-10-19 DIAGNOSIS — R110 Nausea: Secondary | ICD-10-CM | POA: Diagnosis not present

## 2015-10-19 DIAGNOSIS — R1013 Epigastric pain: Secondary | ICD-10-CM

## 2015-10-19 DIAGNOSIS — K3184 Gastroparesis: Secondary | ICD-10-CM

## 2015-10-19 LAB — CBC WITH DIFFERENTIAL/PLATELET
BASOS ABS: 0 10*3/uL (ref 0.0–0.1)
Basophils Relative: 0.8 % (ref 0.0–3.0)
Eosinophils Absolute: 0 10*3/uL (ref 0.0–0.7)
Eosinophils Relative: 1 % (ref 0.0–5.0)
HCT: 34.3 % — ABNORMAL LOW (ref 36.0–46.0)
Hemoglobin: 11.1 g/dL — ABNORMAL LOW (ref 12.0–15.0)
LYMPHS ABS: 1.4 10*3/uL (ref 0.7–4.0)
Lymphocytes Relative: 43.6 % (ref 12.0–46.0)
MCHC: 32.3 g/dL (ref 30.0–36.0)
MCV: 79.8 fl (ref 78.0–100.0)
MONO ABS: 0.2 10*3/uL (ref 0.1–1.0)
Monocytes Relative: 7.3 % (ref 3.0–12.0)
NEUTROS PCT: 47.3 % (ref 43.0–77.0)
Neutro Abs: 1.5 10*3/uL (ref 1.4–7.7)
Platelets: 189 10*3/uL (ref 150.0–400.0)
RBC: 4.3 Mil/uL (ref 3.87–5.11)
RDW: 18.6 % — ABNORMAL HIGH (ref 11.5–15.5)
WBC: 3.2 10*3/uL — ABNORMAL LOW (ref 4.0–10.5)

## 2015-10-19 LAB — COMPREHENSIVE METABOLIC PANEL
ALT: 9 U/L (ref 0–35)
AST: 11 U/L (ref 0–37)
Albumin: 4 g/dL (ref 3.5–5.2)
Alkaline Phosphatase: 29 U/L — ABNORMAL LOW (ref 39–117)
BUN: 9 mg/dL (ref 6–23)
CO2: 29 mEq/L (ref 19–32)
Calcium: 9.6 mg/dL (ref 8.4–10.5)
Chloride: 104 mEq/L (ref 96–112)
Creatinine, Ser: 0.6 mg/dL (ref 0.40–1.20)
GFR: 141.7 mL/min (ref >60.00)
Glucose, Bld: 104 mg/dL — ABNORMAL HIGH (ref 70–99)
Potassium: 4.1 mEq/L (ref 3.5–5.1)
Sodium: 139 mEq/L (ref 135–145)
Total Bilirubin: 1.4 mg/dL — ABNORMAL HIGH (ref 0.2–1.2)
Total Protein: 6.9 g/dL (ref 6.0–8.3)

## 2015-10-19 LAB — GAMMA GT: GGT: 10 U/L (ref 7–51)

## 2015-10-19 LAB — LIPASE: Lipase: 28 U/L (ref 11.0–59.0)

## 2015-10-19 LAB — AMYLASE: Amylase: 36 U/L (ref 27–131)

## 2015-10-19 MED ORDER — RABEPRAZOLE SODIUM 20 MG PO TBEC: 20.0000 mg | DELAYED_RELEASE_TABLET | Freq: Every day | ORAL | Status: DC

## 2015-10-19 MED ORDER — ONDANSETRON HCL 4 MG PO TABS: 4.0000 mg | ORAL_TABLET | Freq: Three times a day (TID) | ORAL | Status: DC | PRN

## 2015-10-19 NOTE — Patient Instructions (Addendum)
We have sent a referral to [facility] center in [place]. Thet will call you with an appointment.  We have sent the following medications to your pharmacy for you to pick up at your convenience: Zofran and Rabeprazole   You have been scheduled for an abdominal ultrasound at [facility] (1st floor of hospital) on 10-29-2015 at 8:30am. Please arrive 15 minutes prior to your appointment for registration. Make certain not to have anything to eat or drink 6 hours prior to your appointment. Should you need to reschedule your appointment, please contact radiology at [phone]. This test typically takes about 30 minutes to perform.  Your physician has requested that you go to the basement for the lab work before leaving today.  Patient wants to follow up with Dr Elease Hashimoto at [place]. Patient will call to make appointment.

## 2015-10-19 NOTE — Progress Notes (Signed)
Patient ID: Tonya Summers, female   DOB: [birth year], 39 y.o.   MRN: [id number]     History of Present Illness: Tonya Summers is a pleasant female from this is Tonya Summers who has previously been followed by Dr. Deatra Ina for gastroparesis which was diagnosed by gastric emptying scan. She took Reglan for 2 days but developed a sore throat and was seen in the emergency room. It was felt that the symptoms were not related to Reglan but she has refused to take it since due to fear of side effects. She was last seen by Dr. Deatra Ina in July at which time she was offered enrollment in the gastroparesis trial which she subsequently declined. She was offered domperidone, but her husband states they read the possible side effects of the medication and did not want her to take it. A gastroparesis diet was explained to her however she does not seem to have understood this and continues to eat a high fat diet. She presents today with ongoing complaints of epigastric pain and nausea that typically occur postprandially. There is no radiation of pain to the back or to the right upper quadrant. She does have occasional nausea with it but has not vomited. She has no heartburn or dysphagia. He had a cholecystectomy in early February by Dr. gross. Gallbladder pathology was compatible with minimal chronic inflammation and cholelithiasis. Following surgery her pain and vomiting resolved but her nausea has persisted. She denies use of nonsteroidal anti-inflammatory drugs. Her bowel movements are fine she's had no bright red blood per rectum or melena.   Past Medical History  Diagnosis Date  . Asthma   . GERD (gastroesophageal reflux disease)     Past Surgical History  Procedure Laterality Date  . Cholecystectomy     History reviewed. No pertinent family history. Social History  Substance Use Topics  . Smoking status: Never Smoker   . Smokeless tobacco: Never Used  . Alcohol Use: No   Current Outpatient Prescriptions  Medication  Sig Dispense Refill  . ranitidine (ZANTAC) 75 MG tablet Take 75 mg by mouth 2 (two) times daily.    . ondansetron (ZOFRAN) 4 MG tablet Take 1 tablet (4 mg total) by mouth every 8 (eight) hours as needed for nausea or vomiting. 30 tablet 1  . RABEprazole (ACIPHEX) 20 MG tablet Take 1 tablet (20 mg total) by mouth daily. 30 tablet 4   No current facility-administered medications for this visit.   No Known Allergies   Review of Systems: Gen: Denies any fever, chills, sweats, anorexia, fatigue, weakness, malaise, weight loss, and sleep disorder CV: Denies chest pain, angina, palpitations, syncope, orthopnea, PND, peripheral edema, and claudication. Resp: Denies dyspnea at rest, dyspnea with exercise, cough, sputum, wheezing, coughing up blood, and pleurisy. GI: Denies vomiting blood, jaundice, and fecal incontinence.   Denies dysphagia or odynophagia. GU : Denies urinary burning, blood in urine, urinary frequency, urinary hesitancy, nocturnal urination, and urinary incontinence. MS: Denies joint pain, limitation of movement, and swelling, stiffness, low back pain, extremity pain. Denies muscle weakness, cramps, atrophy.  Derm: Denies rash, itching, dry skin, hives, moles, warts, or unhealing ulcers.  Psych: Denies depression, anxiety, memory loss, suicidal ideation, hallucinations, paranoia, and confusion. Heme: Denies bruising, bleeding, and enlarged lymph nodes. Neuro:  Denies any headaches, dizziness, paresthesia Endo:  Denies any problems with DM, thyroid, adrenal  LAB RESULTS:  Recent Labs  10/19/15 1019  WBC 3.2*  HGB 11.1*  HCT 34.3*  PLT 189.0   BMET  Recent Labs  10/19/15 1019  NA 139  K 4.1  CL 104  CO2 29  GLUCOSE 104*  BUN 9  CREATININE 0.60  CALCIUM 9.6   LFT  Recent Labs  10/19/15 1019  PROT 6.9  ALBUMIN 4.0  AST 11  ALT 9  ALKPHOS 29*  BILITOT 1.4*    Physical Exam: BP 104/68 mmHg  Pulse 100  Ht 5' 6.75" (1.695 m)  Wt 108 lb 6.4 oz (49.17 kg)   BMI 17.11 kg/m2  LMP 10/07/2015 (Approximate) General: Pleasant, well developed ,female in no acute distress Head: Normocephalic and atraumatic Eyes:  sclerae anicteric, conjunctiva pink  Ears: Normal auditory acuity Lungs: Clear throughout to auscultation Heart: Regular rate and rhythm Abdomen: Soft, non distended, non-tender. No masses, no hepatomegaly. Normal bowel sounds Musculoskeletal: Symmetrical with no gross deformities  Extremities: No edema  Neurological: Alert oriented x 4, grossly nonfocal Psychological:  Alert and cooperative. Normal mood and affect  Assessment and Recommendations: [age]-year-old female with a history of GERD, status post cholecystectomy, with documented gastroparesis, presenting with ongoing postprandial epigastric pain and nausea. Reflux regimen has been reviewed at length and the patient will be scheduled to meet with a dietitian to carefully review a gastroparesis diet and what food choices will be better for the patient. The patient expresses a multitude of culture roll food preferences, which hopefully the dietitian will be able to review more carefully with the patient.  She will be given a trial of rabeprazole 20 mg 1 by mouth every morning 30 minutes prior to breakfast, as well as Zofran 4 mg 1 by mouth every 8 hours when necessary nausea. A CBC, comprehensive metabolic panel, amylase, lipase, and GGT will be obtained. Patient was offered follow-up, but her husband states he thought this was Dr. Osborn Coho office and states they will schedule follow-up with Dr. Cristina Gong.       Tonya Summers, Tonya Summers 10/19/2015,

## 2015-10-19 NOTE — Progress Notes (Signed)
Reviewed and agree with management plan. Former Dr. Deatra Ina patient who plans to have all further GI follow up with Dr. Cristina Gong.   Pricilla Riffle. Fuller Plan, MD [facility]

## 2015-10-20 ENCOUNTER — Other Ambulatory Visit: Payer: Self-pay | Admitting: General Surgery

## 2015-10-20 ENCOUNTER — Ambulatory Visit: Payer: Medicaid Other | Admitting: Physician Assistant

## 2015-10-20 DIAGNOSIS — R928 Other abnormal and inconclusive findings on diagnostic imaging of breast: Secondary | ICD-10-CM

## 2015-10-29 ENCOUNTER — Ambulatory Visit (HOSPITAL_COMMUNITY)
Admission: RE | Admit: 2015-10-29 | Discharge: 2015-10-29 | Disposition: A | Discharge status: Home / Self Care | Payer: Medicaid Other | Source: Ambulatory Visit | Attending: Physician Assistant | Admitting: Physician Assistant

## 2015-10-29 DIAGNOSIS — R1013 Epigastric pain: Secondary | ICD-10-CM | POA: Diagnosis not present

## 2015-10-29 DIAGNOSIS — R110 Nausea: Secondary | ICD-10-CM | POA: Diagnosis present

## 2015-10-29 DIAGNOSIS — K3184 Gastroparesis: Secondary | ICD-10-CM

## 2015-10-29 DIAGNOSIS — Z9049 Acquired absence of other specified parts of digestive tract: Secondary | ICD-10-CM | POA: Insufficient documentation

## 2015-10-29 DIAGNOSIS — N281 Cyst of kidney, acquired: Secondary | ICD-10-CM | POA: Insufficient documentation

## 2015-10-31 HISTORY — PX: BREAST EXCISIONAL BIOPSY: SUR124

## 2015-11-02 ENCOUNTER — Ambulatory Visit: Payer: Medicaid Other | Admitting: Physician Assistant

## 2015-11-09 ENCOUNTER — Other Ambulatory Visit: Payer: Self-pay | Admitting: Emergency Medicine

## 2015-11-09 ENCOUNTER — Telehealth: Payer: Self-pay | Admitting: Physician Assistant

## 2015-11-09 MED ORDER — LANSOPRAZOLE 30 MG PO CPDR: 30.0000 mg | DELAYED_RELEASE_CAPSULE | Freq: Every day | ORAL | Status: DC

## 2015-11-09 NOTE — Telephone Encounter (Addendum)
Spoke to pharmacy, patient has Medicaid. I will start a PA for this RX.   Pts huband called about 5:15pm for information on prescription and it looks like it is going through [place]. In reviewing the recent office note the patient is transferring care to Dr. Cristina Gong [facility]).

## 2015-11-09 NOTE — Telephone Encounter (Signed)
Patient called requesting Rabeprazole prior auth. However I'm not seeing any insurance coverage. I'm guessing medication may is too expensive. Tried to call patient to clarify but no answer. Please advise an alternative medication that would be cheaper for the patient.

## 2015-11-09 NOTE — Telephone Encounter (Signed)
Lansoprazole 30 mg qd.

## 2015-11-10 NOTE — Telephone Encounter (Signed)
Spoke to patient and told her that she should follow up with Dr. Cristina Gong. In the meantime she can take over the counter Prevacid per [employer], PA. Not sure if patient understood this. Called pharmacy and had them put some aside under her name.

## 2015-11-12 ENCOUNTER — Encounter: Payer: Medicaid Other | Attending: Family Medicine | Admitting: Dietician

## 2015-11-12 ENCOUNTER — Encounter: Payer: Self-pay | Admitting: Dietician

## 2015-11-12 VITALS — Ht 68.0 in | Wt 110.0 lb

## 2015-11-12 DIAGNOSIS — Z713 Dietary counseling and surveillance: Secondary | ICD-10-CM | POA: Diagnosis not present

## 2015-11-12 DIAGNOSIS — K3184 Gastroparesis: Secondary | ICD-10-CM | POA: Diagnosis present

## 2015-11-12 DIAGNOSIS — R636 Underweight: Secondary | ICD-10-CM

## 2015-11-12 DIAGNOSIS — K219 Gastro-esophageal reflux disease without esophagitis: Secondary | ICD-10-CM

## 2015-11-12 NOTE — Patient Instructions (Signed)
Eat 5 small meals/snacks daily. Avoid high fiber. Low fat food choices and limited added fats. Supplement options:  Ensure  Boost  Carnation Breakfast (vanilla) Reduce your stress Walk daily (30 minutes most day). Avoid lying down for 2-3 hours after eating.

## 2015-11-12 NOTE — Progress Notes (Signed)
  Medical Nutrition Therapy:  Appt start time: 1030 end time:  1130.   Assessment:  Primary concerns today: Patient is here today with her husband.  She said that she has no concerns and that she has read a lot on the internet.  Referral was for diet for Gastroparesis.  She also has a hx of GERD and asthma.  She is going to get a biopsy on her breast and concerns about her health make her very worried.  She has lost from 161 lbs when she began having problems with eating prior to her gall bladder being removed about 14 months ago.  She is avoiding many foods due to fear of pain.  She eats then lays down at times.  Weight:  110 lbs, Height: 5'8" and she is very small boned.  Patient lives with her husband and 50 yo son.  Her husband shops and she cooks.  She does not use spices, states that she limites her fat intake, and avoids acidic/citrus.  She stays at home.  She as a degree in agricultural studies from a university in [place].  She speaks and reads [place] and husband interprets as needed.     Preferred Learning Style:   No preference indicated   Learning Readiness:   Not ready  Contemplating  Ready  Change in progress   MEDICATIONS: see list   DIETARY INTAKE:  Usual eating pattern includes 2 meals and 1 snacks per day. Avoided foods include vegetables (cause stomach pain), tomatoes, spices, citrus fruit, nuts.  No pork.  They have recently purchased baby food and patient tolerates this well.  She has used Ensure in the past.  24-hr recall:  B (6 AM): 5-7 dates Snk (10-12 AM): Cornflakes and Almond milk OR poached egg and white bread L ( PM): none Snk (12-1PM): Almond milk with cookies D (5PM):  Boiled chicken or fish, white rice, fruit, OR soup with chicken, carrots and potatoes with white pita bread Snk ( PM): none Beverages: guava juice, sprite, water, Almond milk  Usual physical activity: none  Estimated energy needs: 1800 calories 60 g protein  Progress Towards  Goal(s):  In progress.   Nutritional Diagnosis:  NB-1.1 Food and nutrition-related knowledge deficit As related to gastroparesis.  As evidenced by patient report..    Intervention:  Nutrition Counseling/education regarding diet for gastroparesis and GERD as well as recommendations to assist with weight gain and prevention of loss.  Samples of Ensure Enlive provided.  Discussed other nutrition shake options as well.  Eat 5 small meals/snacks daily. Avoid high fiber. Low fat food choices and limited added fats. Supplement options:  Ensure  Boost  Carnation Breakfast (vanilla) Reduce your stress Walk daily (30 minutes most day). Avoid lying down for 2-3 hours after eating.  Teaching Method Utilized:  Visual Auditory  Handouts given during visit include:  Nutrition Therapy for Gastroparesis (AND)  Nutrition Therapy for GERD (AND)  Barriers to learning/adherence to lifestyle change: none Demonstrated degree of understanding via:  Teach Back  Monitoring/Evaluation:  Dietary intake, exercise, and body weight prn.

## 2015-11-18 ENCOUNTER — Encounter (HOSPITAL_BASED_OUTPATIENT_CLINIC_OR_DEPARTMENT_OTHER): Payer: Self-pay | Admitting: *Deleted

## 2015-11-19 ENCOUNTER — Ambulatory Visit: Payer: Medicaid Other | Admitting: Dietician

## 2015-11-22 ENCOUNTER — Encounter (HOSPITAL_BASED_OUTPATIENT_CLINIC_OR_DEPARTMENT_OTHER)
Admission: RE | Admit: 2015-11-22 | Discharge: 2015-11-22 | Disposition: A | Discharge status: Home / Self Care | Payer: Medicaid Other | Source: Ambulatory Visit | Attending: General Surgery | Admitting: General Surgery

## 2015-11-22 ENCOUNTER — Ambulatory Visit
Admission: RE | Admit: 2015-11-22 | Discharge: 2015-11-22 | Disposition: A | Discharge status: Home / Self Care | Payer: Medicaid Other | Source: Ambulatory Visit | Attending: General Surgery | Admitting: General Surgery

## 2015-11-22 DIAGNOSIS — Z01812 Encounter for preprocedural laboratory examination: Secondary | ICD-10-CM | POA: Insufficient documentation

## 2015-11-22 DIAGNOSIS — R928 Other abnormal and inconclusive findings on diagnostic imaging of breast: Secondary | ICD-10-CM

## 2015-11-22 DIAGNOSIS — K8050 Calculus of bile duct without cholangitis or cholecystitis without obstruction: Secondary | ICD-10-CM | POA: Diagnosis not present

## 2015-11-22 LAB — LIPASE, BLOOD: LIPASE: 29 U/L (ref 11–51)

## 2015-11-22 LAB — AMYLASE: AMYLASE: 56 U/L (ref 28–100)

## 2015-11-25 ENCOUNTER — Ambulatory Visit (HOSPITAL_BASED_OUTPATIENT_CLINIC_OR_DEPARTMENT_OTHER): Payer: Medicaid Other | Admitting: Anesthesiology

## 2015-11-25 ENCOUNTER — Encounter (HOSPITAL_BASED_OUTPATIENT_CLINIC_OR_DEPARTMENT_OTHER)
Admission: RE | Disposition: A | Discharge status: Home / Self Care | Payer: Self-pay | Source: Ambulatory Visit | Attending: General Surgery

## 2015-11-25 ENCOUNTER — Encounter (HOSPITAL_BASED_OUTPATIENT_CLINIC_OR_DEPARTMENT_OTHER): Payer: Self-pay | Admitting: *Deleted

## 2015-11-25 ENCOUNTER — Ambulatory Visit (HOSPITAL_BASED_OUTPATIENT_CLINIC_OR_DEPARTMENT_OTHER)
Admission: RE | Admit: 2015-11-25 | Discharge: 2015-11-25 | Disposition: A | Discharge status: Home / Self Care | Payer: Medicaid Other | Source: Ambulatory Visit | Attending: General Surgery | Admitting: General Surgery

## 2015-11-25 ENCOUNTER — Ambulatory Visit
Admission: RE | Admit: 2015-11-25 | Discharge: 2015-11-25 | Disposition: A | Discharge status: Home / Self Care | Payer: Medicaid Other | Source: Ambulatory Visit | Attending: General Surgery | Admitting: General Surgery

## 2015-11-25 DIAGNOSIS — R928 Other abnormal and inconclusive findings on diagnostic imaging of breast: Secondary | ICD-10-CM

## 2015-11-25 DIAGNOSIS — N6091 Unspecified benign mammary dysplasia of right breast: Secondary | ICD-10-CM | POA: Diagnosis not present

## 2015-11-25 HISTORY — PX: RADIOACTIVE SEED GUIDED EXCISIONAL BREAST BIOPSY: SHX6490

## 2015-11-25 SURGERY — RADIOACTIVE SEED GUIDED BREAST BIOPSY
Anesthesia: General | Site: Breast | Laterality: Right

## 2015-11-25 MED ORDER — EPHEDRINE SULFATE 50 MG/ML IJ SOLN
INTRAMUSCULAR | Status: DC | PRN
  Administered 2015-11-25 (×2): 10 mg via INTRAVENOUS

## 2015-11-25 MED ORDER — ACETAMINOPHEN 650 MG RE SUPP: 650.0000 mg | RECTAL | Status: DC | PRN

## 2015-11-25 MED ORDER — DEXAMETHASONE SODIUM PHOSPHATE 10 MG/ML IJ SOLN
INTRAMUSCULAR | Status: AC
  Filled 2015-11-25: qty 1

## 2015-11-25 MED ORDER — ONDANSETRON HCL 4 MG/2ML IJ SOLN
INTRAMUSCULAR | Status: DC | PRN
Start: 2015-11-25 — End: 2015-11-25
  Administered 2015-11-25: 4 mg via INTRAVENOUS

## 2015-11-25 MED ORDER — SODIUM CHLORIDE 0.9% FLUSH: 3.0000 mL | Freq: Two times a day (BID) | INTRAVENOUS | Status: DC

## 2015-11-25 MED ORDER — OXYCODONE HCL 5 MG PO TABS: 5.0000 mg | ORAL_TABLET | ORAL | Status: DC | PRN

## 2015-11-25 MED ORDER — FENTANYL CITRATE (PF) 100 MCG/2ML IJ SOLN
50.0000 ug | INTRAMUSCULAR | Status: DC | PRN
  Administered 2015-11-25 (×2): 50 ug via INTRAVENOUS

## 2015-11-25 MED ORDER — MIDAZOLAM HCL 2 MG/2ML IJ SOLN
1.0000 mg | INTRAMUSCULAR | Status: DC | PRN
  Administered 2015-11-25: 2 mg via INTRAVENOUS

## 2015-11-25 MED ORDER — ACETAMINOPHEN 325 MG PO TABS: 650.0000 mg | ORAL_TABLET | ORAL | Status: DC | PRN

## 2015-11-25 MED ORDER — GLYCOPYRROLATE 0.2 MG/ML IJ SOLN: 0.2000 mg | Freq: Once | INTRAMUSCULAR | Status: DC | PRN

## 2015-11-25 MED ORDER — LIDOCAINE HCL 1 % IJ SOLN
INTRAMUSCULAR | Status: DC | PRN
  Administered 2015-11-25: 10 mL via INTRAMUSCULAR

## 2015-11-25 MED ORDER — MIDAZOLAM HCL 2 MG/2ML IJ SOLN
INTRAMUSCULAR | Status: AC
Start: 2015-11-25 — End: 2015-11-25
  Filled 2015-11-25: qty 2

## 2015-11-25 MED ORDER — FENTANYL CITRATE (PF) 100 MCG/2ML IJ SOLN: 25.0000 ug | INTRAMUSCULAR | Status: DC | PRN

## 2015-11-25 MED ORDER — PROPOFOL 10 MG/ML IV BOLUS
INTRAVENOUS | Status: DC | PRN
  Administered 2015-11-25: 150 mg via INTRAVENOUS

## 2015-11-25 MED ORDER — METOCLOPRAMIDE HCL 5 MG/ML IJ SOLN: 10.0000 mg | Freq: Once | INTRAMUSCULAR | Status: DC | PRN

## 2015-11-25 MED ORDER — CEFAZOLIN SODIUM-DEXTROSE 2-3 GM-% IV SOLR
2.0000 g | INTRAVENOUS | Status: AC
  Administered 2015-11-25: 2 g via INTRAVENOUS

## 2015-11-25 MED ORDER — LACTATED RINGERS IV SOLN
INTRAVENOUS | Status: DC
  Administered 2015-11-25 (×2): via INTRAVENOUS

## 2015-11-25 MED ORDER — FENTANYL CITRATE (PF) 100 MCG/2ML IJ SOLN
INTRAMUSCULAR | Status: AC
  Filled 2015-11-25: qty 2

## 2015-11-25 MED ORDER — MEPERIDINE HCL 25 MG/ML IJ SOLN: 6.2500 mg | INTRAMUSCULAR | Status: DC | PRN

## 2015-11-25 MED ORDER — SCOPOLAMINE 1 MG/3DAYS TD PT72: 1.0000 | MEDICATED_PATCH | Freq: Once | TRANSDERMAL | Status: DC | PRN

## 2015-11-25 MED ORDER — OXYCODONE-ACETAMINOPHEN 5-325 MG PO TABS: 1.0000 | ORAL_TABLET | ORAL | Status: DC | PRN

## 2015-11-25 MED ORDER — DEXAMETHASONE SODIUM PHOSPHATE 4 MG/ML IJ SOLN
INTRAMUSCULAR | Status: DC | PRN
  Administered 2015-11-25: 10 mg via INTRAVENOUS

## 2015-11-25 MED ORDER — ERTAPENEM SODIUM 1 G IJ SOLR
INTRAMUSCULAR | Status: AC
  Filled 2015-11-25: qty 1

## 2015-11-25 MED ORDER — SODIUM CHLORIDE 0.9 % IV SOLN: 1.0000 g | INTRAVENOUS | Status: DC

## 2015-11-25 MED ORDER — SODIUM CHLORIDE 0.9% FLUSH: 3.0000 mL | INTRAVENOUS | Status: DC | PRN

## 2015-11-25 MED ORDER — LIDOCAINE HCL (CARDIAC) 20 MG/ML IV SOLN
INTRAVENOUS | Status: AC
  Filled 2015-11-25: qty 5

## 2015-11-25 MED ORDER — SODIUM CHLORIDE 0.9 % IV SOLN: 250.0000 mL | INTRAVENOUS | Status: DC | PRN

## 2015-11-25 MED ORDER — CEFAZOLIN SODIUM 1-5 GM-% IV SOLN
INTRAVENOUS | Status: AC
  Filled 2015-11-25: qty 100

## 2015-11-25 MED ORDER — HYDROCODONE-ACETAMINOPHEN 7.5-325 MG PO TABS: 1.0000 | ORAL_TABLET | Freq: Once | ORAL | Status: DC | PRN

## 2015-11-25 MED ORDER — LIDOCAINE HCL (CARDIAC) 20 MG/ML IV SOLN
INTRAVENOUS | Status: DC | PRN
  Administered 2015-11-25: 50 mg via INTRAVENOUS

## 2015-11-25 MED ORDER — ONDANSETRON HCL 4 MG/2ML IJ SOLN
INTRAMUSCULAR | Status: AC
  Filled 2015-11-25: qty 2

## 2015-11-25 SURGICAL SUPPLY — 56 items
BLADE HEX COATED 2.75 (ELECTRODE) ×2 IMPLANT
BLADE SURG 10 STRL SS (BLADE) IMPLANT
BLADE SURG 15 STRL LF DISP TIS (BLADE) ×1 IMPLANT
BLADE SURG 15 STRL SS (BLADE) ×2
CANISTER SUC SOCK COL 7IN (MISCELLANEOUS) IMPLANT
CANISTER SUCT 1200ML W/VALVE (MISCELLANEOUS) ×2 IMPLANT
CHLORAPREP W/TINT 26ML (MISCELLANEOUS) ×2 IMPLANT
CLIP TI LARGE 6 (CLIP) IMPLANT
COVER BACK TABLE 60X90IN (DRAPES) ×2 IMPLANT
COVER MAYO STAND STRL (DRAPES) ×2 IMPLANT
COVER PROBE W GEL 5X96 (DRAPES) ×2 IMPLANT
DECANTER SPIKE VIAL GLASS SM (MISCELLANEOUS) IMPLANT
DEVICE DUBIN W/COMP PLATE 8390 (MISCELLANEOUS) ×2 IMPLANT
DRAPE LAPAROSCOPIC ABDOMINAL (DRAPES) ×2 IMPLANT
DRAPE UTILITY XL STRL (DRAPES) ×2 IMPLANT
ELECT REM PT RETURN 9FT ADLT (ELECTROSURGICAL) ×2
ELECTRODE REM PT RTRN 9FT ADLT (ELECTROSURGICAL) ×1 IMPLANT
GLOVE BIO SURGEON STRL SZ 6 (GLOVE) ×2 IMPLANT
GLOVE BIOGEL PI IND STRL 6.5 (GLOVE) ×1 IMPLANT
GLOVE BIOGEL PI IND STRL 8 (GLOVE) IMPLANT
GLOVE BIOGEL PI INDICATOR 6.5 (GLOVE) ×2
GLOVE BIOGEL PI INDICATOR 8 (GLOVE) ×1
GLOVE SURG SS PI 6.0 STRL IVOR (GLOVE) ×1 IMPLANT
GLOVE SURG SS PI 8.5 STRL IVOR (GLOVE) ×1
GLOVE SURG SS PI 8.5 STRL STRW (GLOVE) IMPLANT
GOWN STRL REUS W/ TWL LRG LVL3 (GOWN DISPOSABLE) ×1 IMPLANT
GOWN STRL REUS W/ TWL XL LVL3 (GOWN DISPOSABLE) IMPLANT
GOWN STRL REUS W/TWL 2XL LVL3 (GOWN DISPOSABLE) ×2 IMPLANT
GOWN STRL REUS W/TWL LRG LVL3 (GOWN DISPOSABLE) ×2
GOWN STRL REUS W/TWL XL LVL3 (GOWN DISPOSABLE) ×2
ILLUMINATOR WAVEGUIDE N/F (MISCELLANEOUS) IMPLANT
KIT MARKER MARGIN INK (KITS) ×2 IMPLANT
LIGHT WAVEGUIDE WIDE FLAT (MISCELLANEOUS) ×1 IMPLANT
LIQUID BAND (GAUZE/BANDAGES/DRESSINGS) ×2 IMPLANT
NDL HYPO 25X1 1.5 SAFETY (NEEDLE) ×1 IMPLANT
NEEDLE HYPO 25X1 1.5 SAFETY (NEEDLE) ×2 IMPLANT
NS IRRIG 1000ML POUR BTL (IV SOLUTION) ×2 IMPLANT
PACK BASIN DAY SURGERY FS (CUSTOM PROCEDURE TRAY) ×2 IMPLANT
PENCIL BUTTON HOLSTER BLD 10FT (ELECTRODE) ×2 IMPLANT
SLEEVE SCD COMPRESS KNEE MED (MISCELLANEOUS) ×2 IMPLANT
SPONGE GAUZE 4X4 12PLY STER LF (GAUZE/BANDAGES/DRESSINGS) ×2 IMPLANT
SPONGE LAP 18X18 X RAY DECT (DISPOSABLE) ×2 IMPLANT
STAPLER VISISTAT 35W (STAPLE) IMPLANT
STRIP CLOSURE SKIN 1/2X4 (GAUZE/BANDAGES/DRESSINGS) ×2 IMPLANT
SUT MON AB 4-0 PC3 18 (SUTURE) ×2 IMPLANT
SUT SILK 2 0 SH (SUTURE) IMPLANT
SUT VIC AB 2-0 SH 18 (SUTURE) IMPLANT
SUT VIC AB 3-0 SH 27 (SUTURE)
SUT VIC AB 3-0 SH 27X BRD (SUTURE) IMPLANT
SUT VICRYL 3-0 CR8 SH (SUTURE) ×2 IMPLANT
SYR BULB 3OZ (MISCELLANEOUS) ×2 IMPLANT
SYR CONTROL 10ML LL (SYRINGE) ×2 IMPLANT
TOWEL OR 17X24 6PK STRL BLUE (TOWEL DISPOSABLE) ×2 IMPLANT
TOWEL OR NON WOVEN STRL DISP B (DISPOSABLE) ×1 IMPLANT
TUBE CONNECTING 20X1/4 (TUBING) ×2 IMPLANT
YANKAUER SUCT BULB TIP NO VENT (SUCTIONS) ×2 IMPLANT

## 2015-11-25 NOTE — H&P (Signed)
Tonya Summers Location: [facility] Patient #: [id number] DOB: 1976/05/16 Married / Language: English / Race: Undefined Female   History of Present Illness Patient words: right breast eval.  The patient is a 40 year old female who presents with a complaint of Breast problems. Patient is a 40 year old female seen in consultation at the request of Dr. Romeo Apple for a new diagnosis of an abnormal right mammogram. The patient underwent her first screening mammogram and was found to have a 3 mm focus of calcifications in the upper right breast. She has not ever palpated a breast mass. She denies family history of breast cancer. She does not have personal history of cancer. She underwent core needle biopsy of this breast abnormality, and the diagnosis was atypical ductal hyperplasia with calcifications. She was referred for surgical intervention for discordance.   Allergies  No Known Drug Allergies12/29/2015  Medication History Zofran ODT (4MG  Tablet Disperse, 1 (one) Tablet Disperse Oral every six hours, as needed, Taken starting 12/10/2014) Active. Multi Vitamin Daily (Oral) Active. ZyrTEC Allergy (10MG  Capsule, Oral) Active. Terconazole (0.4% Cream, Vaginal) Active. Omeprazole (20MG  Capsule DR, Oral) Active. Medications Reconciled  Vitals  Weight: 111 lb Height: 66in Body Surface Area: 1.56 m Body Mass Index: 17.92 kg/m  Temp.: 66F(Temporal)  Pulse: 76 (Regular)  BP: 128/78 (Sitting, Left Arm, Standard)    Physical Exam  General Mental Status-Alert. General Appearance-Consistent with stated age. Hydration-Well hydrated. Voice-Normal. Note: wearing head scarf   Head and Neck Head-normocephalic, atraumatic with no lesions or palpable masses.  Eye Sclera/Conjunctiva - Bilateral-No scleral icterus.  Chest and Lung Exam Chest and lung exam reveals -quiet, even and easy respiratory effort with no use of accessory  muscles. Inspection Chest Wall - Normal. Back - normal.  Breast Note: Small breasted. No palpable mass. faint swelling at 12 o'clock. no lymphadenopathy.   Cardiovascular Cardiovascular examination reveals -normal pedal pulses bilaterally. Note: regular rate and rhythm  Abdomen Inspection-Inspection Normal. Palpation/Percussion Palpation and Percussion of the abdomen reveal - Soft, Non Tender, No Rebound tenderness, No Rigidity (guarding) and No hepatosplenomegaly.  Peripheral Vascular Upper Extremity Inspection - Bilateral - Normal - No Clubbing, No Cyanosis, No Edema, Pulses Intact. Lower Extremity Palpation - Edema - Bilateral - No edema.  Neurologic Neurologic evaluation reveals -alert and oriented x 3 with no impairment of recent or remote memory. Mental Status-Normal.  Musculoskeletal Global Assessment -Note: no gross deformities.  Normal Exam - Left-Upper Extremity Strength Normal and Lower Extremity Strength Normal. Normal Exam - Right-Upper Extremity Strength Normal and Lower Extremity Strength Normal.  Lymphatic Head & Neck  General Head & Neck Lymphatics: Bilateral - Description - Normal. Axillary  General Axillary Region: Bilateral - Description - Normal. Tenderness - Non Tender.    Assessment & Plan ABNORMAL MAMMOGRAM OF RIGHT BREAST (R92.8) Impression: Patient has an abnormal right mammogram with discordant biopsy. I recommend seed localized excisional breast biopsy for complete accuracy of diagnosis. I counseled the patient and her husband about the reasons for this. I reviewed that cancer can be adjacent or present within this abnormal area and not show up on the core needle biopsy. I reviewed what the surgery would entail including the seed placement. I reviewed that she would be asleep for the operation and could go home the same day. I discussed anticipation for limit on postoperative lifting and pain control.  The patient is extremely  nervous but willing to undergo surgery. Current Plans You are being scheduled for surgery - Our schedulers will call you.  You should hear from our office's scheduling department within 5 working days about the location, date, and time of surgery. We try to make accommodations for patient's preferences in scheduling surgery, but sometimes the OR schedule or the surgeon's schedule prevents [place] from making those accommodations.  If you have not heard from our office [phone]) in 5 working days, call the office and ask for your surgeon's nurse.  If you have other questions about your diagnosis, plan, or surgery, call the office and ask for your surgeon's nurse.  Pt Education - breast surgery: discussed with patient and provided information.   Signed by Stark Klein, MD

## 2015-11-25 NOTE — Anesthesia Preprocedure Evaluation (Signed)
Anesthesia Evaluation  Patient identified by MRN, date of birth, ID band Patient awake    Reviewed: Allergy & Precautions, NPO status , Patient's Chart, lab work & pertinent test results  Airway Mallampati: I  TM Distance: >3 FB Neck ROM: Full    Dental  (+) Teeth Intact   Pulmonary asthma ,    Pulmonary exam normal breath sounds clear to auscultation       Cardiovascular negative cardio ROS Normal cardiovascular exam Rhythm:Regular Rate:Normal     Neuro/Psych negative neurological ROS     GI/Hepatic GERD  Medicated and Controlled,  Endo/Other  Abnormal mammogram  Renal/GU negative Renal ROS Bladder dysfunction   negative genitourinary   Musculoskeletal negative musculoskeletal ROS (+)   Abdominal   Peds  Hematology negative hematology ROS (+)   Anesthesia Other Findings   Reproductive/Obstetrics                             Anesthesia Physical Anesthesia Plan  ASA: II  Anesthesia Plan: General   Post-op Pain Management:    Induction: Intravenous  Airway Management Planned: LMA  Additional Equipment:   Intra-op Plan:   Post-operative Plan: Extubation in OR  Informed Consent: I have reviewed the patients History and Physical, chart, labs and discussed the procedure including the risks, benefits and alternatives for the proposed anesthesia with the patient or authorized representative who has indicated his/her understanding and acceptance.   Dental advisory given  Plan Discussed with: CRNA, Anesthesiologist and Surgeon  Anesthesia Plan Comments:         Anesthesia Quick Evaluation

## 2015-11-25 NOTE — Transfer of Care (Signed)
Immediate Anesthesia Transfer of Care Note  Patient: Tonya Summers  Procedure(s) Performed: Procedure(s): RADIOACTIVE SEED GUIDED EXCISIONAL BREAST BIOPSY (Right)  Patient Location: PACU  Anesthesia Type:General  Level of Consciousness: awake  Airway & Oxygen Therapy: Patient Spontanous Breathing and Patient connected to face mask oxygen  Post-op Assessment: Report given to RN and Post -op Vital signs reviewed and stable  Post vital signs: Reviewed and stable  Last Vitals:  Filed Vitals:   11/25/15 0741  BP: 108/50  Pulse: 81  Temp: 36.5 C  Resp: 20    Complications: No apparent anesthesia complications

## 2015-11-25 NOTE — Anesthesia Postprocedure Evaluation (Signed)
Anesthesia Post Note  Patient: Tonya Summers  Procedure(s) Performed: Procedure(s) (LRB): RADIOACTIVE SEED GUIDED EXCISIONAL BREAST BIOPSY (Right)  Patient location during evaluation: PACU Anesthesia Type: General Level of consciousness: awake and alert and oriented Pain management: pain level controlled Vital Signs Assessment: post-procedure vital signs reviewed and stable Respiratory status: spontaneous breathing, nonlabored ventilation and respiratory function stable Cardiovascular status: blood pressure returned to baseline and stable Postop Assessment: no signs of nausea or vomiting Anesthetic complications: no    Last Vitals:  Filed Vitals:   11/25/15 1100 11/25/15 1115  BP: 120/72 119/73  Pulse: 71 73  Temp:    Resp: 14 14    Last Pain:  Filed Vitals:   11/25/15 1122  PainSc: 0-No pain                 Carlyne Keehan A.

## 2015-11-25 NOTE — Anesthesia Procedure Notes (Signed)
Procedure Name: LMA Insertion Date/Time: 11/25/2015 9:37 AM Performed by: Lieutenant Diego Pre-anesthesia Checklist: Patient identified, Emergency Drugs available, Suction available and Patient being monitored Patient Re-evaluated:Patient Re-evaluated prior to inductionOxygen Delivery Method: Circle System Utilized Preoxygenation: Pre-oxygenation with 100% oxygen Intubation Type: IV induction Ventilation: Mask ventilation without difficulty LMA: LMA inserted LMA Size: 4.0 Number of attempts: 1 Airway Equipment and Method: Bite block Placement Confirmation: positive ETCO2 and breath sounds checked- equal and bilateral Tube secured with: Tape Dental Injury: Teeth and Oropharynx as per pre-operative assessment

## 2015-11-25 NOTE — Op Note (Signed)
Right Breast Radioactive seed localized excisional biopsy  Indications: This patient presents with history of abnormal right mammogram and discordant core needle biopsy  Pre-operative Diagnosis: abnormal right mammogram  Post-operative Diagnosis: Same  Surgeon: Stark Klein   Anesthesia: General endotracheal anesthesia  ASA Class: 2  Procedure Details  The patient was seen in the Holding Room. The risks, benefits, complications, treatment options, and expected outcomes were discussed with the patient. The possibilities of bleeding, infection, the need for additional procedures, failure to diagnose a condition, and creating a complication requiring transfusion or operation were discussed with the patient. The patient concurred with the proposed plan, giving informed consent.  The site of surgery properly noted/marked. The patient was taken to Operating Room # 8, identified, and the procedure verified as Right Breast seed localized excisional biopsy. A Time Out was held and the above information confirmed.  The right breast and chest were prepped and draped in standard fashion. The lumpectomy was performed by creating an transverse incision over the upper outer quadrant of the breast over the previously placed radioactive seed.  Dissection was carried down to around the point of maximum signal intensity. The cautery was used to perform the dissection.  Hemostasis was achieved with cautery. The specimen was inked with the margin marker paint kit.    Specimen radiography confirmed inclusion of the mammographic lesion, the clip, and the seed.  The background signal in the breast was zero.  The wound was irrigated and closed with 3-0 vicryl in layers and 4-0 monocryl subcuticular suture.      Sterile dressings were applied. At the end of the operation, all sponge, instrument, and needle counts were correct.  Findings: grossly clear surgical margins and no adenopathy  Estimated Blood Loss:  min      Specimens: right breast excisional biopsy         Complications:  None; patient tolerated the procedure well.         Disposition: PACU - hemodynamically stable.         Condition: stable

## 2015-11-25 NOTE — Discharge Instructions (Addendum)
[facility] °Office Phone Number [phone] ° °BREAST BIOPSY/ PARTIAL MASTECTOMY: POST OP INSTRUCTIONS ° °Always review your discharge instruction sheet given to you by the facility where your surgery was performed. ° °IF YOU HAVE DISABILITY OR FAMILY LEAVE FORMS, YOU MUST BRING THEM TO THE OFFICE FOR PROCESSING.  DO NOT GIVE THEM TO YOUR DOCTOR. ° °1. A prescription for pain medication may be given to you upon discharge.  Take your pain medication as prescribed, if needed.  If narcotic pain medicine is not needed, then you may take acetaminophen (Tylenol) or ibuprofen (Advil) as needed. °2. Take your usually prescribed medications unless otherwise directed °3. If you need a refill on your pain medication, please contact your pharmacy.  They will contact our office to request authorization.  Prescriptions will not be filled after 5pm or on week-ends. °4. You should eat very light the first 24 hours after surgery, such as soup, crackers, pudding, etc.  Resume your normal diet the day after surgery. °5. Most patients will experience some swelling and bruising in the breast.  Ice packs and a good support bra will help.  Swelling and bruising can take several days to resolve.  °6. It is common to experience some constipation if taking pain medication after surgery.  Increasing fluid intake and taking a stool softener will usually help or prevent this problem from occurring.  A mild laxative (Milk of Magnesia or Miralax) should be taken according to package directions if there are no bowel movements after 48 hours. °7. Unless discharge instructions indicate otherwise, you may remove your bandages 48 hours after surgery, and you may shower at that time.  You may have steri-strips (small skin tapes) in place directly over the incision.  These strips should be left on the skin for 7-10 days.   Any sutures or staples will be removed at the office during your follow-up visit. °8. ACTIVITIES:  You may resume  regular daily activities (gradually increasing) beginning the next day.  Wearing a good support bra or sports bra (or the breast binder) minimizes pain and swelling.  You may have sexual intercourse when it is comfortable. °a. You may drive when you no longer are taking prescription pain medication, you can comfortably wear a seatbelt, and you can safely maneuver your car and apply brakes. °b. RETURN TO WORK:  __________1 week_______________ °9. You should see your doctor in the office for a follow-up appointment approximately two weeks after your surgery.  Your doctor’s nurse will typically make your follow-up appointment when she calls you with your pathology report.  Expect your pathology report 2-3 business days after your surgery.  You may call to check if you do not hear from us after three days. ° ° °WHEN TO CALL YOUR DOCTOR: °1. Fever over 101.0 °2. Nausea and/or vomiting. °3. Extreme swelling or bruising. °4. Continued bleeding from incision. °5. Increased pain, redness, or drainage from the incision. ° °The clinic staff is available to answer your questions during regular business hours.  Please don’t hesitate to call and ask to speak to one of the nurses for clinical concerns.  If you have a medical emergency, go to the nearest emergency room or call 911.  A surgeon from [facility] is always on call at the hospital. ° °For further questions, please visit [email or web address]  ° ° °Post Anesthesia Home Care Instructions ° °Activity: °Get plenty of rest for the remainder of the day. A responsible adult should stay with you for 24   hours following the procedure.  °For the next 24 hours, DO NOT: °-Drive a car °-Operate machinery °-Drink alcoholic beverages °-Take any medication unless instructed by your physician °-Make any legal decisions or sign important papers. ° °Meals: °Start with liquid foods such as gelatin or soup. Progress to regular foods as tolerated. Avoid greasy, spicy, heavy  foods. If nausea and/or vomiting occur, drink only clear liquids until the nausea and/or vomiting subsides. Call your physician if vomiting continues. ° °Special Instructions/Symptoms: °Your throat may feel dry or sore from the anesthesia or the breathing tube placed in your throat during surgery. If this causes discomfort, gargle with warm salt water. The discomfort should disappear within 24 hours. ° °If you had a scopolamine patch placed behind your ear for the management of post- operative nausea and/or vomiting: ° °1. The medication in the patch is effective for 72 hours, after which it should be removed.  Wrap patch in a tissue and discard in the trash. Wash hands thoroughly with soap and water. °2. You may remove the patch earlier than 72 hours if you experience unpleasant side effects which may include dry mouth, dizziness or visual disturbances. °3. Avoid touching the patch. Wash your hands with soap and water after contact with the patch. °  ° °

## 2015-11-25 NOTE — Interval H&P Note (Signed)
History and Physical Interval Note:  11/25/2015 9:10 AM  Tonya Summers  has presented today for surgery, with the diagnosis of RIGHT ABNORMAL MAMMOGRAM  The various methods of treatment have been discussed with the patient and family. After consideration of risks, benefits and other options for treatment, the patient has consented to  Procedure(s): RADIOACTIVE SEED GUIDED EXCISIONAL BREAST BIOPSY (Right) as a surgical intervention .  The patient's history has been reviewed, patient examined, no change in status, stable for surgery.  I have reviewed the patient's chart and labs.  Questions were answered to the patient's satisfaction.     Tonya Summers

## 2015-11-26 ENCOUNTER — Encounter (HOSPITAL_BASED_OUTPATIENT_CLINIC_OR_DEPARTMENT_OTHER): Payer: Self-pay | Admitting: General Surgery

## 2015-11-26 NOTE — Progress Notes (Signed)
Quick Note:  Please let patient know that biopsy is negative for cancer. ______ 

## 2015-12-24 ENCOUNTER — Ambulatory Visit (INDEPENDENT_AMBULATORY_CARE_PROVIDER_SITE_OTHER): Payer: Medicaid Other | Admitting: Gastroenterology

## 2015-12-24 ENCOUNTER — Encounter: Payer: Self-pay | Admitting: Gastroenterology

## 2015-12-24 VITALS — BP 122/60 | HR 88 | Ht 66.75 in | Wt 104.2 lb

## 2015-12-24 DIAGNOSIS — F329 Major depressive disorder, single episode, unspecified: Secondary | ICD-10-CM

## 2015-12-24 DIAGNOSIS — F32A Depression, unspecified: Secondary | ICD-10-CM

## 2015-12-24 DIAGNOSIS — K3184 Gastroparesis: Secondary | ICD-10-CM

## 2015-12-24 DIAGNOSIS — K3189 Other diseases of stomach and duodenum: Secondary | ICD-10-CM

## 2015-12-24 DIAGNOSIS — R110 Nausea: Secondary | ICD-10-CM

## 2015-12-24 DIAGNOSIS — K219 Gastro-esophageal reflux disease without esophagitis: Secondary | ICD-10-CM | POA: Diagnosis not present

## 2015-12-24 MED ORDER — AMITRIPTYLINE HCL 25 MG PO TABS: 25.0000 mg | ORAL_TABLET | Freq: Every day | ORAL | Status: DC

## 2015-12-24 MED ORDER — PANTOPRAZOLE SODIUM 40 MG PO TBEC: 40.0000 mg | DELAYED_RELEASE_TABLET | Freq: Every day | ORAL | Status: DC

## 2015-12-24 NOTE — Progress Notes (Signed)
Tonya Summers    [id number]    1975/11/02  Primary Care Physician:SMITH,KRISTI, MD  Referring Physician: Wardell Honour, MD [place], [place] [address]  Chief complaint: Nausea  HPI: 40 year old female who has previously been followed by Dr. Deatra Ina for for chronic nausea here to establish care with me . Gastric emptying scan in May 2016 was suggestive of gastroparesis with slight delay in gastric emptying  She took Reglan for 2 days but developed a sore throat and was seen in the emergency room. It was felt that the symptoms were not related to Reglan but she has refused to take it since due to fear of side effects. She was seen by Dr. Deatra Ina in July at which time she was offered enrollment in the gastroparesis trial which she subsequently declined. She was offered domperidone, but her husband states they read the possible side effects of the medication and did not want her to take it. She had a cholecystectomy in early February by Dr. gross. Gallbladder pathology was compatible with minimal chronic inflammation and cholelithiasis. Following surgery her pain and vomiting resolved but her nausea has persisted. She is not following gastroparesis diet. She has intermittent heartburn and regurgitation but is currently not on PPI. Her predominant complaint is nausea which improves after she eats. She has some postprandial fullness and epigastric discomfort but no vomiting. Her bowel movements are regular. Denies any melena or bright red blood per rectum.   Outpatient Encounter Prescriptions as of 12/24/2015  Medication Sig  . [DISCONTINUED] ondansetron (ZOFRAN) 4 MG tablet Take 1 tablet (4 mg total) by mouth every 8 (eight) hours as needed for nausea or vomiting.  . [DISCONTINUED] oxyCODONE-acetaminophen (ROXICET) 5-325 MG tablet Take 1-2 tablets by mouth every 4 (four) hours as needed for severe pain.   No facility-administered encounter medications on file as of 12/24/2015.     Allergies as of 12/24/2015  . (No Known Allergies)    Past Medical History  Diagnosis Date  . Asthma   . GERD (gastroesophageal reflux disease)     Past Surgical History  Procedure Laterality Date  . Cholecystectomy    . Radioactive seed guided excisional breast biopsy Right 11/25/2015    Procedure: RADIOACTIVE SEED GUIDED EXCISIONAL BREAST BIOPSY;  Surgeon: Stark Klein, MD;  Location: [place];  Service: General;  Laterality: Right;    History reviewed. No pertinent family history.  Social History   Social History  . Marital Status: Married    Spouse Name: N/A  . Number of Children: 1  . Years of Education: N/A   Occupational History  . Not on file.   Social History Main Topics  . Smoking status: Never Smoker   . Smokeless tobacco: Never Used  . Alcohol Use: No  . Drug Use: No  . Sexual Activity: Yes    Birth Control/ Protection: None   Other Topics Concern  . Not on file   Social History Narrative   Marital status: married      Children: 1 child (6)      Moved from [place] in 2016      Review of systems: Review of Systems  Constitutional: Negative for fever and chills.  HENT: Negative.   Eyes: Negative for blurred vision.  Respiratory: Negative for cough, shortness of breath and wheezing.   Cardiovascular: Negative for chest pain and palpitations.  Gastrointestinal: as per HPI Genitourinary: Negative for dysuria, urgency, frequency and hematuria.  Musculoskeletal: Positive for myalgias, back pain and joint pain.  Skin: Negative for itching and rash.  Neurological: Negative for dizziness, tremors, focal weakness, seizures and loss of consciousness.  Endo/Heme/Allergies: Negative for environmental allergies.  Psychiatric/Behavioral: Positive for depression, negative for suicidal ideas and hallucinations.  All other systems reviewed and are negative.   Physical Exam: Filed Vitals:   12/24/15 0852  BP: 122/60  Pulse: 88   Gen:       No acute distress HEENT:  EOMI, sclera anicteric Neck:     No masses; no thyromegaly Lungs:    Clear to auscultation bilaterally; normal respiratory effort CV:         Regular rate and rhythm; no murmurs Abd:      + bowel sounds; soft, non-tender; no palpable masses, no distension Ext:    No edema; adequate peripheral perfusion Skin:      Warm and dry; no rash Neuro: alert and oriented x 3 Psych: normal mood and affect  Data Reviewed: Abdominal ultrasound 08/2014 . No abdominal aortic aneurysm is seen. 2. Echogenic debris within the gallbladder may represent gallbladder sludge or possibly non shadowing small gallstones. 3. The head and tail of the pancreas are obscured by bowel gas.  Gastric emptying scan 02/2015 Expected location of the stomach in the left upper quadrant. Ingested meal empties the stomach gradually over the course of the study.  19% emptied at 1 hr ( normal >= 10%)  43% emptied at 2 hr ( normal >= 40%)  70% emptied at 3 hr ( normal >= 70%)  81% emptied at 4 hr ( normal >= 90%)  IMPRESSION: Slightly delayed gastric emptying at 4 hours.  EGD 02/2015 Normal  Abdominal ultrasound 09/2015 1. Status post cholecystectomy. 2. Normal appearance of the liver. 3. No hydronephrosis. Incidental note of small left renal cyst.  Assessment and Plan/Recommendations:  40 year old female here with for follow-up visit with complaints of chronic nausea Gastric emptying scan did show some slight delay in gastric emptying suggestive of gastroparesis Patient did report of being depressed for past few months to years on review of system and is very reluctant to seek help We'll start low-dose Elavil 25 mg at bedtime Encouraged patient and her husband to make appointment with behavior health for evaluation and management of her depression Low fiber low-fat gastroparesis diet and advise patient to eat small frequent meals Start omeprazole 20 mg daily Patient did  mention an interesting make an appointment with Dr. Cristina Gong in the past, if she does wish to switch her care we can send our records to Dr. Osborn Coho office if not follow up  in 3 months  K. Denzil Magnuson , MD [phone] Mon-Fri 8a-5p [phone] after 5p, weekends, holidays

## 2015-12-24 NOTE — Patient Instructions (Signed)
Gastroparesis diet given Please contact behavioral health at [phone] to schedule your appointment (handout given for [place])  We will send in Protonix to your pharmacy We will send in Elavil 25mg  to your pharmacy

## 2016-02-01 ENCOUNTER — Telehealth: Payer: Self-pay | Admitting: Gastroenterology

## 2016-02-01 MED ORDER — PANTOPRAZOLE SODIUM 40 MG PO TBEC: 40.0000 mg | DELAYED_RELEASE_TABLET | Freq: Every day | ORAL | Status: DC

## 2016-02-01 NOTE — Telephone Encounter (Signed)
Medication sent to pharmacy as requested.

## 2016-03-20 ENCOUNTER — Telehealth: Payer: Self-pay | Admitting: Gastroenterology

## 2016-03-20 MED ORDER — AMITRIPTYLINE HCL 25 MG PO TABS: 25.0000 mg | ORAL_TABLET | Freq: Every day | ORAL | Status: DC

## 2016-03-20 MED ORDER — PANTOPRAZOLE SODIUM 40 MG PO TBEC: 40.0000 mg | DELAYED_RELEASE_TABLET | Freq: Every day | ORAL | Status: DC

## 2016-03-20 NOTE — Telephone Encounter (Signed)
Yes she will need to continue the meds unless her symptoms have improved. She may need a follow up office visit if she continues to have significant symptoms

## 2016-03-20 NOTE — Telephone Encounter (Signed)
Return call at [phone]

## 2016-03-20 NOTE — Telephone Encounter (Signed)
Dr Nandigam please advise 

## 2016-03-20 NOTE — Telephone Encounter (Signed)
Wants to know if she can fast for month next month  If it would be ok,, she is filling better and sent refills to [facility]

## 2016-03-21 NOTE — Telephone Encounter (Signed)
ok 

## 2016-03-21 NOTE — Telephone Encounter (Signed)
Left voicemail advising patient that per Dr Silverio Decamp, she may fast.

## 2016-07-28 ENCOUNTER — Ambulatory Visit: Payer: Medicaid Other | Admitting: Certified Nurse Midwife

## 2016-09-12 ENCOUNTER — Ambulatory Visit (INDEPENDENT_AMBULATORY_CARE_PROVIDER_SITE_OTHER): Payer: Self-pay | Admitting: Physician Assistant

## 2016-09-12 ENCOUNTER — Encounter: Payer: Self-pay | Admitting: Family Medicine

## 2016-09-12 VITALS — BP 100/62 | HR 88 | Temp 97.6°F | Resp 16 | Ht 67.0 in | Wt 146.6 lb

## 2016-09-12 DIAGNOSIS — L989 Disorder of the skin and subcutaneous tissue, unspecified: Secondary | ICD-10-CM

## 2016-09-12 NOTE — Progress Notes (Deleted)
     IF you received an x-ray today, you will receive an invoice from [facility]. Please contact  Radiology at [phone] with questions or concerns regarding your invoice.   IF you received labwork today, you will receive an invoice from [facility]. Please contact [facility] at [phone] with questions or concerns regarding your invoice.   Our billing staff will not be able to assist you with questions regarding bills from these companies.  You will be contacted with the lab results as soon as they are available. The fastest way to get your results is to activate your My Chart account. Instructions are located on the last page of this paperwork. If you have not heard from us regarding the results in 2 weeks, please contact this office.      

## 2016-09-12 NOTE — Progress Notes (Signed)
   Tonya Summers  MRN: [id number] DOB: 1976/03/08  PCP: [name], MD  Subjective:  Pt is a 40 year old female who presents to clinic for bump on left leg x several years. Is located on the back of her left thigh.   She went to [facility] two days ago for the same and was told it is seborrheic keratosis. They could not remove it for her, and this is what she would like to have done today. She is tired of it and would like it removed. Notes it is sometimes painful when she sits for extended periods, occasionally bleeds a little. It has not changed in size, shape or color. Denies itching. She says it is bothersome and she would like it removed today.   Review of Systems  Constitutional: Negative for chills, diaphoresis, fatigue and fever.  Respiratory: Negative for cough, chest tightness, shortness of breath and wheezing.   Cardiovascular: Negative for chest pain and palpitations.  Gastrointestinal: Negative for abdominal pain, diarrhea, nausea and vomiting.  Musculoskeletal: Negative for gait problem and myalgias.  Skin: Positive for rash.  Neurological: Negative for weakness, light-headedness and headaches.    Patient Active Problem List   Diagnosis Date Noted  . 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 Gastroparesis 05/24/2015  . Nausea without vomiting 01/29/2015  . GERD (gastroesophageal reflux disease) 09/09/2014    Current Outpatient Prescriptions on File Prior to Visit  Medication Sig Dispense Refill  . amitriptyline (ELAVIL) 25 MG tablet Take 1 tablet (25 mg total) by mouth at bedtime. (Patient not taking: Reported on 09/12/2016) 30 tablet 3  . pantoprazole (PROTONIX) 40 MG tablet Take 1 tablet (40 mg total) by mouth daily. (Patient not taking: Reported on 09/12/2016) 30 tablet 11   No current facility-administered medications on file prior to visit.     No Known Allergies   Objective:  BP 100/62 (BP Location: Left Arm, Patient Position: Sitting, Cuff Size: Normal)   Pulse  88   Temp 97.6 F (36.4 C) (Oral)   Resp 16   Ht 5\' 7"  (1.702 m)   Wt 146 lb 9.6 oz (66.5 kg)   SpO2 100%   BMI 22.96 kg/m   Physical Exam  Constitutional: She is oriented to person, place, and time and well-developed, well-nourished, and in no distress. No distress.  Cardiovascular: Normal rate, regular rhythm and normal heart sounds.   Pulmonary/Chest: Effort normal. No respiratory distress.  Neurological: She is alert and oriented to person, place, and time. GCS score is 15.  Skin: Skin is warm and dry.     Psychiatric: Mood, memory, affect and judgment normal.  Vitals reviewed.  Procedure:  Verbal consent obtained. Area was cleaned with betadine and anesthetized with 1.5 cc of Benzocaine. Stalk was cut off with curved iris scissors. Minor bleeding, well controlled with a few minutes of pressure with gauze. Wound was dressed with gauze and tape. Mass was sent to pathology.  Assessment and Plan :  1. Encounter for removal of skin lesion - Dermatology pathology - Pathology report pending. Will contact with results.  - Wound care discussed with patient. RTC if she is having any problems. She understands and agrees.    Mercer Pod, [name]  [facility] 09/12/2016 10:15 AM

## 2016-09-18 ENCOUNTER — Encounter: Payer: Self-pay | Admitting: Physician Assistant

## 2016-10-13 ENCOUNTER — Other Ambulatory Visit (HOSPITAL_COMMUNITY)
Admission: RE | Admit: 2016-10-13 | Discharge: 2016-10-13 | Disposition: A | Discharge status: Home / Self Care | Payer: Medicaid Other | Source: Ambulatory Visit | Attending: Certified Nurse Midwife | Admitting: Certified Nurse Midwife

## 2016-10-13 ENCOUNTER — Encounter: Payer: Self-pay | Admitting: Certified Nurse Midwife

## 2016-10-13 ENCOUNTER — Ambulatory Visit (INDEPENDENT_AMBULATORY_CARE_PROVIDER_SITE_OTHER): Payer: Medicaid Other | Admitting: Certified Nurse Midwife

## 2016-10-13 VITALS — BP 135/78 | HR 82 | Ht 68.0 in | Wt 153.6 lb

## 2016-10-13 DIAGNOSIS — N90810 Female genital mutilation status, unspecified: Secondary | ICD-10-CM

## 2016-10-13 DIAGNOSIS — Z01419 Encounter for gynecological examination (general) (routine) without abnormal findings: Secondary | ICD-10-CM | POA: Insufficient documentation

## 2016-10-13 DIAGNOSIS — Z0000 Encounter for general adult medical examination without abnormal findings: Secondary | ICD-10-CM

## 2016-10-13 DIAGNOSIS — Z1151 Encounter for screening for human papillomavirus (HPV): Secondary | ICD-10-CM | POA: Insufficient documentation

## 2016-10-13 DIAGNOSIS — R011 Cardiac murmur, unspecified: Secondary | ICD-10-CM

## 2016-10-13 DIAGNOSIS — N979 Female infertility, unspecified: Secondary | ICD-10-CM

## 2016-10-13 DIAGNOSIS — K219 Gastro-esophageal reflux disease without esophagitis: Secondary | ICD-10-CM

## 2016-10-13 DIAGNOSIS — M25552 Pain in left hip: Secondary | ICD-10-CM

## 2016-10-13 MED ORDER — OB COMPLETE PETITE 35-5-1-200 MG PO CAPS: 1.0000 | ORAL_CAPSULE | Freq: Every day | ORAL | 12 refills | Status: DC

## 2016-10-13 NOTE — Progress Notes (Signed)
Patient has pain on R - especially on the R. It has occurred for the last 2 months. Patient does reports she had early cycles twice this year- starting 2 weeks early. Patient has pain with IC- worse in the last 2 months

## 2016-10-13 NOTE — Progress Notes (Signed)
Subjective:        Tonya Summers is a 40 y.o. female here for a routine exam.  Current complaints: discharge with odor, for several weeks with lower abdominal pain.  Monthly periods, lasting about 7 days with menorrhagia for 3-4 days, denies clots, or cramping with cycles.  Desires pregnancy.  Had radioactive seed localization on 11/25/15 and breast biopsy, negative for cancer. Reports lower right sided abdominal pain for 2 months, cramping type pain.  Reports in [place] having [place] that showed left fallopian tube blockage.  Has been trying for pregnancy since 2011.  Encouraged regular sexual activity.    Here for exam with interpreter.  Educated on [place] not paying for infertility treatments.  Will await [place] results before scheduling with Dr. Darreld Mclean.    Personal health questionnaire:  Is patient [name], have a family history of breast and/or ovarian cancer: no Is there a family history of uterine cancer diagnosed at age < 67, gastrointestinal cancer, urinary tract cancer, family member who is a Field seismologist syndrome-associated carrier: no Is the patient overweight and hypertensive, family history of diabetes, personal history of gestational diabetes, preeclampsia or PCOS: no Is patient over 69, have PCOS,  family history of premature CHD under age 55, diabetes, smoke, have hypertension or peripheral artery disease:  no At any time, has a partner hit, kicked or otherwise hurt or frightened you?: no Over the past 2 weeks, have you felt down, depressed or hopeless?: no Over the past 2 weeks, have you felt little interest or pleasure in doing things?:no   Gynecologic History Patient's last menstrual period was 09/30/2016. Contraception: none Last Pap: 07/23/15. Results were: normal Last mammogram: 11/25/15. Results were: had breast biopsy on right breast, benign.   Obstetric History OB History  Gravida Para Term Preterm AB Living  6 1 1   5 1   SAB TAB Ectopic Multiple Live Births  5             # Outcome Date GA Lbr Len/2nd Weight Sex Delivery Anes PTL Lv  6 Term 03/01/06    M Vag-Spont     5 SAB           4 SAB           3 SAB           2 SAB           1 SAB             Obstetric Comments  All miscarriages were at or before 10-11 weks    Past Medical History:  Diagnosis Date  . Asthma   . GERD (gastroesophageal reflux disease)     Past Surgical History:  Procedure Laterality Date  . CHOLECYSTECTOMY    . RADIOACTIVE SEED GUIDED EXCISIONAL BREAST BIOPSY Right 11/25/2015   Procedure: RADIOACTIVE SEED GUIDED EXCISIONAL BREAST BIOPSY;  Surgeon: Stark Klein, MD;  Location: [facility];  Service: General;  Laterality: Right;     Current Outpatient Prescriptions:  .  pantoprazole (PROTONIX) 40 MG tablet, Take 1 tablet (40 mg total) by mouth daily., Disp: 30 tablet, Rfl: 11 .  ranitidine (ZANTAC) 150 MG capsule, Take 150 mg by mouth 2 (two) times daily., Disp: , Rfl:  .  amitriptyline (ELAVIL) 25 MG tablet, Take 1 tablet (25 mg total) by mouth at bedtime. (Patient not taking: Reported on 09/12/2016), Disp: 30 tablet, Rfl: 3 .  Prenat-FeCbn-FeAspGl-FA-Omega (OB COMPLETE PETITE) 35-5-1-200 MG CAPS, Take 1 tablet by mouth daily.,  Disp: 30 capsule, Rfl: 12 No Known Allergies  Social History  Substance Use Topics  . Smoking status: Never Smoker  . Smokeless tobacco: Never Used  . Alcohol use No    No family history on file.    Review of Systems  Constitutional: negative for fatigue and weight loss Respiratory: negative for cough and wheezing Cardiovascular: negative for chest pain, fatigue and palpitations Gastrointestinal: negative for abdominal pain and change in bowel habits Musculoskeletal:negative for myalgias Neurological: negative for gait problems and tremors Behavioral/Psych: negative for abusive relationship, depression Endocrine: negative for temperature intolerance    Genitourinary:negative for abnormal menstrual periods, genital lesions,  hot flashes, sexual problems and vaginal discharge Integument/breast: negative for breast lump, breast tenderness, nipple discharge and skin lesion(s)    Objective:       BP 135/78   Pulse 82   Ht 5\' 8"  (1.727 m)   Wt 153 lb 9.6 oz (69.7 kg)   LMP 09/30/2016   BMI 23.35 kg/m  General:   alert  Skin:   no rash or abnormalities  Lungs:   clear to auscultation bilaterally  Heart:   regular rate and rhythm, S1, S2 normal, + diastolic murmur, no click, rub or gallop  Breasts:   normal without suspicious masses, skin or nipple changes or axillary nodes, right breast biopsy site healed.   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    Assessment:    Healthy female exam.   H/o infertility  Cardiac diastolic murmur ?Anemia  Language barrier  Right side pelvic pain  Female circumcision history   H/O GERD  Plan:    Education reviewed: calcium supplements, depression evaluation, low fat, low cholesterol diet, safe sex/STD prevention, self breast exams, skin cancer screening and weight bearing exercise. Contraception: none. Mammogram ordered. Follow up in: 1 month.   Meds ordered this encounter  Medications  . ranitidine (ZANTAC) 150 MG capsule    Sig: Take 150 mg by mouth 2 (two) times daily.  . Prenat-FeCbn-FeAspGl-FA-Omega (OB COMPLETE PETITE) 35-5-1-200 MG CAPS    Sig: Take 1 tablet by mouth daily.    Dispense:  30 capsule    Refill:  12   Orders Placed This  Encounter  Procedures  . US Pelvis Complete    Standing Status:   Future    Standing Expiration Date:   12/14/2017    Order Specific Question:   Reason for Exam (SYMPTOM  OR DIAGNOSIS REQUIRED)    Answer:   lower right sided abdominal pain    Order Specific Question:   Preferred imaging location?    Answer:   [facility]  . US Transvaginal Non-OB    Standing Status:   Future    Standing Expiration Date:   12/14/2017    Order Specific Question:   Reason for Exam (SYMPTOM  OR DIAGNOSIS REQUIRED)    Answer:   lower right side abdominal pain for 2 months.    Order Specific Question:   Preferred imaging location?    Answer:   [facility]  . [place] Sonohysterogram  Standing Status:   Future    Standing Expiration Date:   12/14/2017    Order Specific Question:   Reason for Exam (SYMPTOM  OR DIAGNOSIS REQUIRED)    Answer:   hx in [place] of left fallopean tube blockage, wants to know if right side is open, infertility desires pregnancy: with dye.    Order Specific Question:   Preferred imaging location?    Answer:   [facility] BILATER[facility]    Standing Status:   Future    Standing Expiration Date:   12/14/2017    Order Specific Question:   Reason for Exam (SYMPTOM  OR DIAGNOSIS REQUIRED)    Answer:   well woman exam    Order Specific Question:   Is the patient pregnant?    Answer:   No    Order Specific Question:   Preferred imaging location?    Answer:   [facility]  . NuSwab Vaginitis Plus (VG+)  . Ambulatory referral to Cardiology    Referral Priority:   Routine    Referral Type:   Consultation    Referral Reason:   Specialty Services Required    Requested Specialty:   Cardiology    Number of Visits Requested:   1   Need to obtain previous records Possible management options include:Infertility consult Dr. Darreld Mclean.  Follow up after pelvic US for infertility management.

## 2016-10-18 LAB — CYTOLOGY - PAP
DIAGNOSIS: NEGATIVE
HPV: NOT DETECTED

## 2016-10-19 LAB — NUSWAB VAGINITIS PLUS (VG+)
CANDIDA ALBICANS, NAA: NEGATIVE
CANDIDA GLABRATA, NAA: NEGATIVE
Chlamydia trachomatis, NAA: NEGATIVE
NEISSERIA GONORRHOEAE, NAA: NEGATIVE
TRICH VAG BY NAA: NEGATIVE

## 2016-10-24 ENCOUNTER — Other Ambulatory Visit: Payer: Self-pay | Admitting: Certified Nurse Midwife

## 2016-10-24 DIAGNOSIS — Z1231 Encounter for screening mammogram for malignant neoplasm of breast: Secondary | ICD-10-CM

## 2016-10-31 ENCOUNTER — Other Ambulatory Visit: Payer: Self-pay | Admitting: Certified Nurse Midwife

## 2016-11-02 ENCOUNTER — Telehealth: Payer: Self-pay | Admitting: *Deleted

## 2016-11-02 NOTE — Telephone Encounter (Signed)
Pt husband  Called to office regarding u/s appt.  Attempt to contact, LM on VM making aware of appt date/time and location. Advised to call office if any other questions.

## 2016-11-09 ENCOUNTER — Ambulatory Visit (INDEPENDENT_AMBULATORY_CARE_PROVIDER_SITE_OTHER): Payer: Medicaid Other

## 2016-11-09 DIAGNOSIS — N979 Female infertility, unspecified: Secondary | ICD-10-CM | POA: Diagnosis not present

## 2016-11-09 DIAGNOSIS — M25552 Pain in left hip: Secondary | ICD-10-CM

## 2016-11-15 ENCOUNTER — Ambulatory Visit: Payer: Medicaid Other

## 2016-12-13 ENCOUNTER — Encounter: Payer: Self-pay | Admitting: Obstetrics

## 2016-12-13 ENCOUNTER — Ambulatory Visit (INDEPENDENT_AMBULATORY_CARE_PROVIDER_SITE_OTHER): Payer: Medicaid Other | Admitting: Obstetrics

## 2016-12-13 VITALS — BP 161/73 | HR 88 | Wt 164.3 lb

## 2016-12-13 DIAGNOSIS — N979 Female infertility, unspecified: Secondary | ICD-10-CM

## 2016-12-13 DIAGNOSIS — Z3169 Encounter for other general counseling and advice on procreation: Secondary | ICD-10-CM | POA: Diagnosis not present

## 2016-12-13 DIAGNOSIS — D251 Intramural leiomyoma of uterus: Secondary | ICD-10-CM

## 2016-12-13 DIAGNOSIS — N946 Dysmenorrhea, unspecified: Secondary | ICD-10-CM | POA: Diagnosis not present

## 2016-12-13 MED ORDER — IBUPROFEN 800 MG PO TABS: 800.0000 mg | ORAL_TABLET | Freq: Three times a day (TID) | ORAL | 5 refills | Status: DC | PRN

## 2016-12-13 MED ORDER — OB COMPLETE PETITE 35-5-1-200 MG PO CAPS: 1.0000 | ORAL_CAPSULE | Freq: Every day | ORAL | 12 refills | Status: DC

## 2016-12-13 NOTE — Progress Notes (Signed)
Patient is in the office to review her US results. 

## 2016-12-13 NOTE — Progress Notes (Signed)
Patient ID: Tonya Summers, female   DOB: 04-24-1976, 41 y.o.   MRN: [id number]  Chief Complaint  Patient presents with  . Advice Only    HPI Tonya Summers is a 41 y.o. female.  Presents for results of ultrasound and preconception counseling.  No complaints. HPI  Past Medical History:  Diagnosis Date  . Asthma   . GERD (gastroesophageal reflux disease)     Past Surgical History:  Procedure Laterality Date  . CHOLECYSTECTOMY    . RADIOACTIVE SEED GUIDED EXCISIONAL BREAST BIOPSY Right 11/25/2015   Procedure: RADIOACTIVE SEED GUIDED EXCISIONAL BREAST BIOPSY;  Surgeon: Stark Klein, MD;  Location: [place];  Service: General;  Laterality: Right;    History reviewed. No pertinent family history.  Social History Social History  Substance Use Topics  . Smoking status: Never Smoker  . Smokeless tobacco: Never Used  . Alcohol use No    No Known Allergies  Current Outpatient Prescriptions  Medication Sig Dispense Refill  . pantoprazole (PROTONIX) 40 MG tablet Take 1 tablet (40 mg total) by mouth daily. 30 tablet 11  . Prenat-FeCbn-FeAspGl-FA-Omega (OB COMPLETE PETITE) 35-5-1-200 MG CAPS Take 1 tablet by mouth daily. 30 capsule 12  . ranitidine (ZANTAC) 150 MG capsule Take 150 mg by mouth 2 (two) times daily.    Marland Kitchen amitriptyline (ELAVIL) 25 MG tablet Take 1 tablet (25 mg total) by mouth at bedtime. (Patient not taking: Reported on 09/12/2016) 30 tablet 3  . ibuprofen (ADVIL,MOTRIN) 800 MG tablet Take 1 tablet (800 mg total) by mouth every 8 (eight) hours as needed. 30 tablet 5   No current facility-administered medications for this visit.     Review of Systems Review of Systems Constitutional: negative for fatigue and weight loss Respiratory: negative for cough and wheezing Cardiovascular: negative for chest pain, fatigue and palpitations Gastrointestinal: negative for abdominal pain and change in bowel habits Genitourinary:negative Integument/breast: negative  for nipple discharge Musculoskeletal:negative for myalgias Neurological: negative for gait problems and tremors Behavioral/Psych: negative for abusive relationship, depwall uf ression Endocrine: negative for temperature intolerance      Blood pressure (!) 161/73, pulse 88, weight 164 lb 4.8 oz (74.5 kg), last menstrual period 11/28/2016.  Physical Exam Physical Exam:  Deferred >50% of 15 min visit spent on counseling and coordination of care.    Data Reviewed Ultrasound:  1 small fibroid in posterior wall of uterus, otherwise normal anatomy  Assessment     Small uterine fibroid, mural, posterior  Dysmenorrhea AMA Preconception Counseling and Advice    Plan     Folic Acid Rx  Ibuprofen Rx  F/U prn  No orders of the defined types were placed in this encounter.  Meds ordered this encounter  Medications  . Prenat-FeCbn-FeAspGl-FA-Omega (OB COMPLETE PETITE) 35-5-1-200 MG CAPS    Sig: Take 1 tablet by mouth daily.    Dispense:  30 capsule    Refill:  12  . ibuprofen (ADVIL,MOTRIN) 800 MG tablet    Sig: Take 1 tablet (800 mg total) by mouth every 8 (eight) hours as needed.    Dispense:  30 tablet    Refill:  5

## 2017-03-09 ENCOUNTER — Ambulatory Visit: Payer: Medicaid Other | Admitting: Obstetrics

## 2017-03-17 IMAGING — CT CT CTA ABD/PEL W/CM AND/OR W/O CM
4 of 9 series · 13 of 36 positions shown, 17 images · IV contrast (75CC ISOVUE 370)
Comparison: Abdominal ultrasound 09/21/2014

CLINICAL DATA: 39-year-old with nausea and right upper quadrant
pain since cholecystectomy.

EXAM:
CT ANGIOGRAPHY ABDOMEN AND PELVIS
TECHNIQUE: Multidetector CT imaging of the abdomen and pelvis was performed
using the standard protocol during bolus administration of
intravenous contrast. Multiplanar reconstructed images including
MIPs were obtained and reviewed to evaluate the vascular anatomy.
CONTRAST:  75 mL Isovue 370

[Series 5: arterial (id) · axial · arterial · 0.59mm/px · z∈[-294,-14]mm · 5 of 168 slices shown]
[im 28/168  soft-tissue]
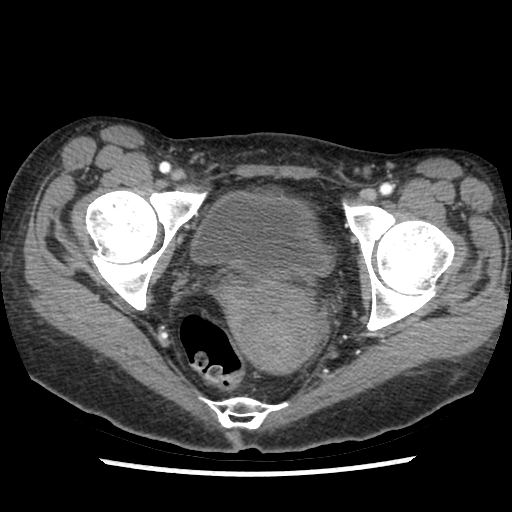
[im 56/168  soft-tissue]
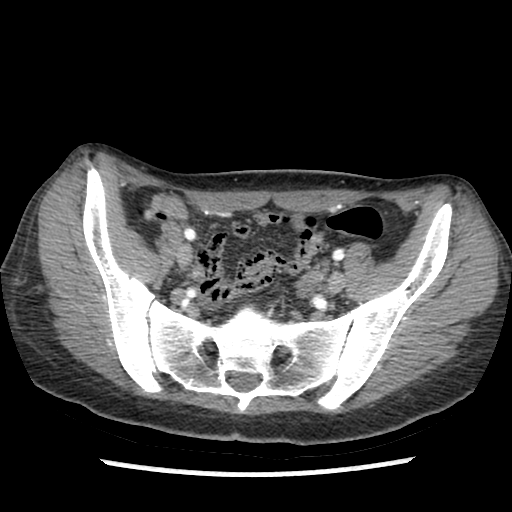
[im 84/168  soft-tissue]
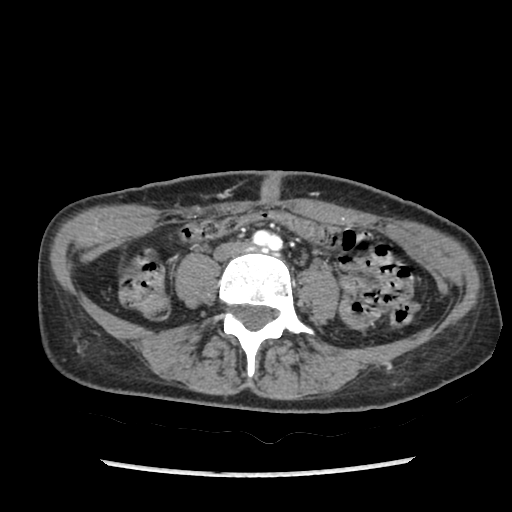
[im 112/168  soft-tissue]
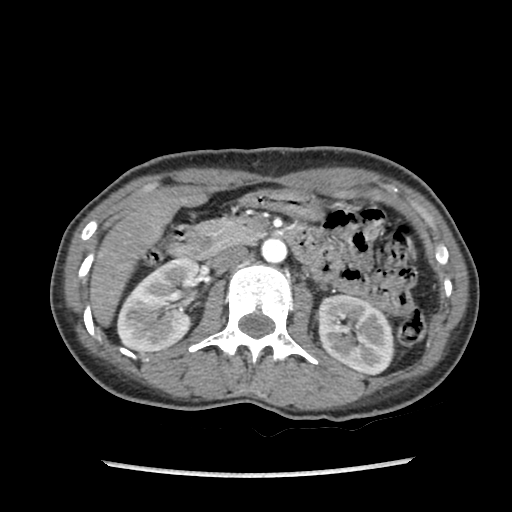
[im 140/168  soft-tissue]
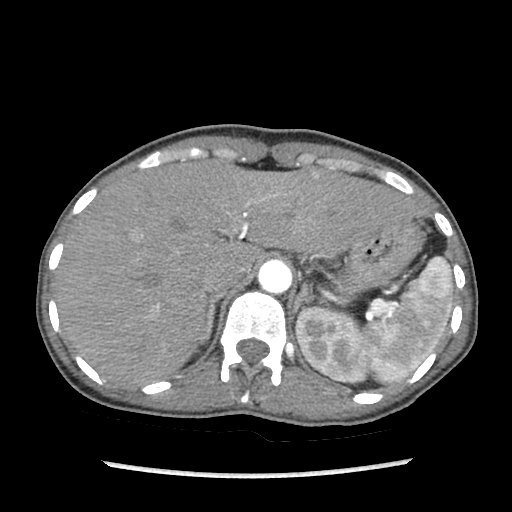

[Series 8: portal venous 5mm · axial · portal-venous · 0.59mm/px · z∈[-379,+56]mm · 3 of 82 slices shown, 7 images]
[im 1/82  soft-tissue]
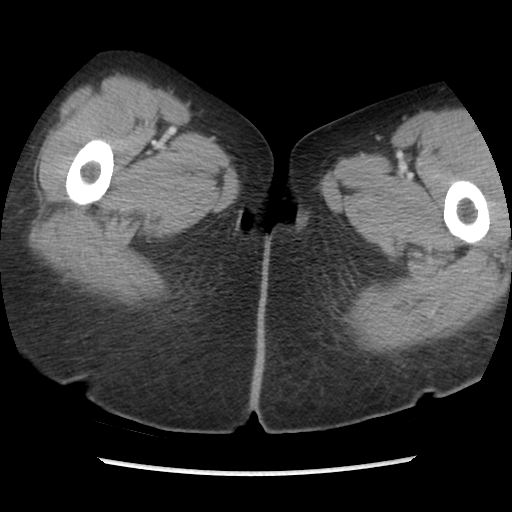
[im 1/82  lung]
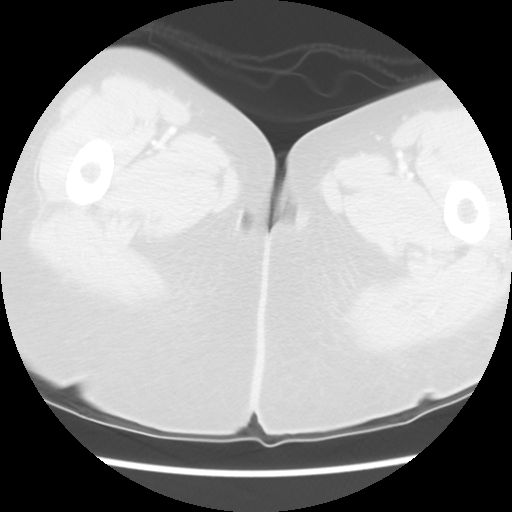
[im 1/82  bone]
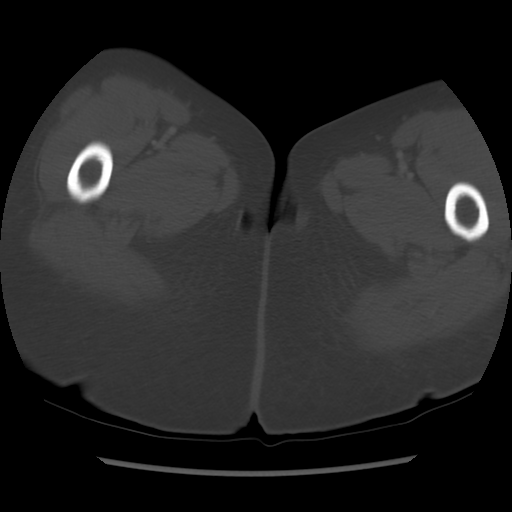
[im 41/82  soft-tissue]
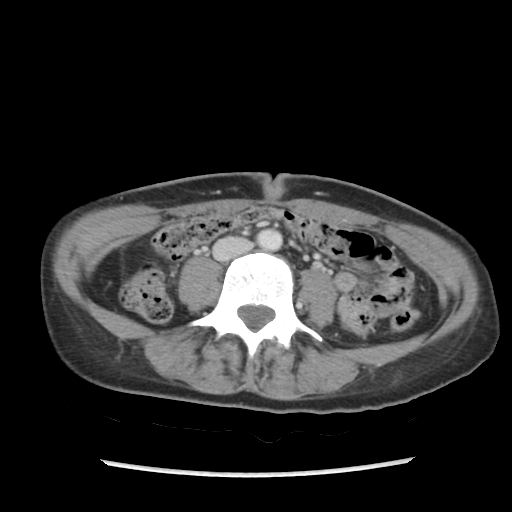
[im 41/82  lung]
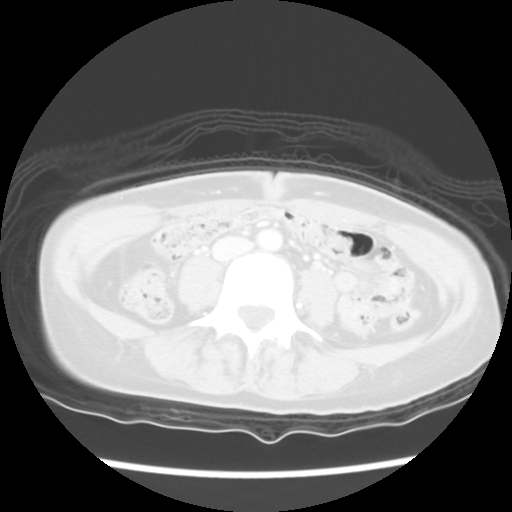
[im 82/82  soft-tissue]
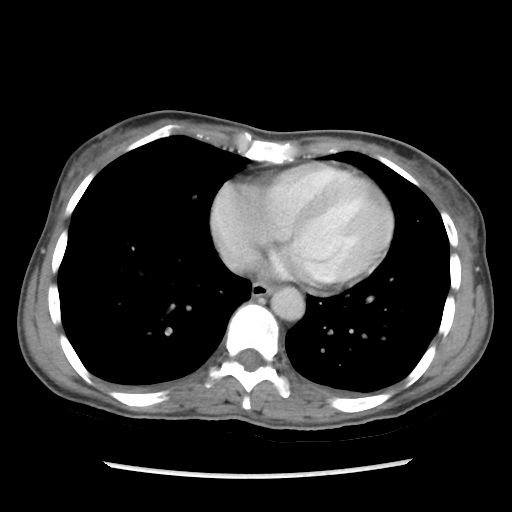
[im 82/82  lung]
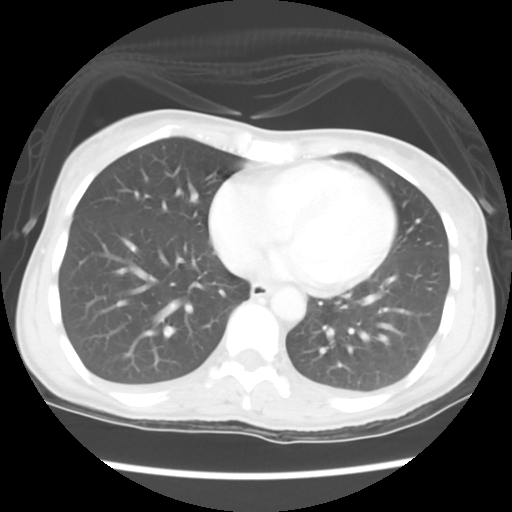

[Series 602: sagittal body · sagittal · 0.86mm/px · 3 of 121 slices shown (1 of 2)]
[im 31/121  soft-tissue]
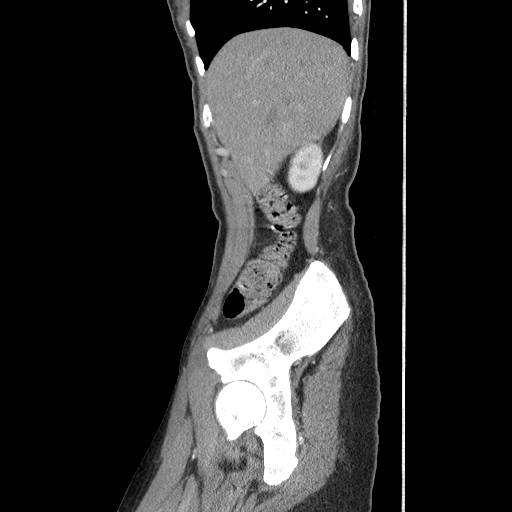
[im 61/121  soft-tissue]
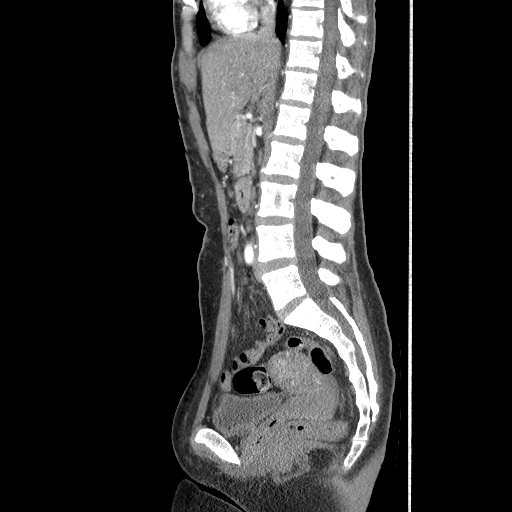
[im 91/121  soft-tissue]
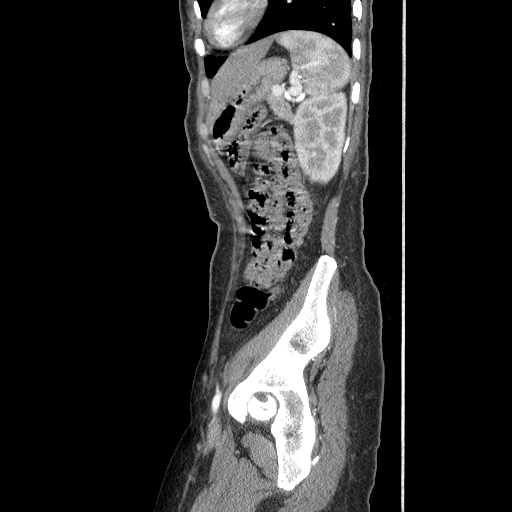

[Series 607: sagittal body · sagittal · 0.86mm/px · 2 of 121 slices shown (2 of 2)]
[im 31/121  soft-tissue]
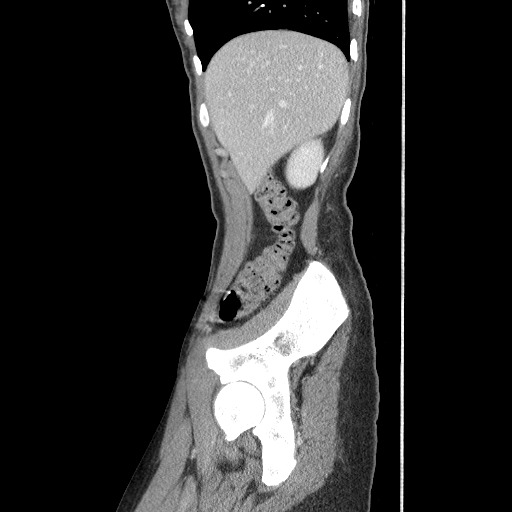
[im 61/121  soft-tissue]
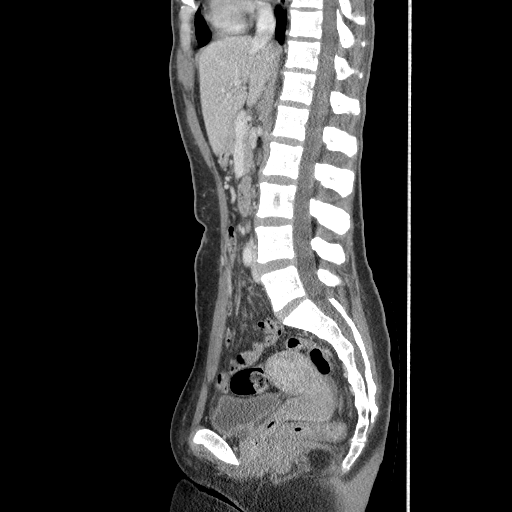

[13 of 36 positions shown; findings below may reference images not displayed]

FINDINGS: ARTERIAL FINDINGS:

Aorta: Normal caliber of the abdominal aorta without significant
plaque or calcifications. Negative for an aortic dissection.

Celiac axis: Celiac trunk and main branch vessels are patent. No
vascular abnormality near the cholecystectomy clips.

Superior mesenteric: Superior mesenteric artery is widely patent.

Left renal: Single left renal artery is widely patent without plaque
or stenosis.

Right renal: There is an accessory superior right renal artery
supplying the upper pole. The main right renal artery is widely
patent without plaque or stenosis.

Inferior mesenteric: Inferior mesenteric artery is patent.

Left iliac: Left iliac arteries are widely patent without plaque or
stenosis. The proximal left femoral arteries are patent.

Right iliac: Right iliac arteries are widely patent without plaque
or stenosis. Proximal right femoral arteries are patent.

Venous findings: Portal venous system is patent. IVC and renal veins
are patent. Iliac veins patent.

Review of the MIP images confirms the above findings.

NONVASCULAR FINDINGS:

Lung bases are clear except for a punctate nodule at the left lung
base measuring 2 mm. Nodule is best seen on sequence 10, image 8.
There is a 1.3 cm low-density structure along the left kidney upper
pole. Hounsfield units on this lesion are indeterminate, measuring
between 20-40 Hounsfield units. This area was not clearly identified
on the prior abdominal ultrasound. Probable tiny cyst along the left
kidney lower pole which is too small to be characterized. Normal
appearance of the right kidney. Normal appearance of the adrenal
glands. No gross abnormality at the cholecystectomy bed. Normal
appearance of the liver, spleen and pancreas. No significant
lymphadenopathy. Trace amount of free fluid adjacent to the
posterior uterus is probably physiologic. No gross abnormalities to
the uterus or ovarian tissue. Fluid in the urinary bladder. Normal
appearance of the small and large bowel. No acute bone abnormality.
IMPRESSION: Normal appearance of the abdominal aorta, visceral arteries and
iliac arteries. No evidence for atherosclerotic plaque or stenosis.

There is a 1.3 cm low-density structure in the left kidney upper
pole. This could represent a mildly complex cyst but indeterminate.
This could be more definitively characterized with a pre and post
contrast MRI.

No acute abnormality in the abdomen or pelvis.

2 mm punctate nodular density at the left lung base is
indeterminate. If the patient is at high risk for bronchogenic
carcinoma, follow-up chest CT at 1 year is recommended. If the
patient is at low risk, no follow-up is needed. This recommendation
follows the consensus statement: Guidelines for Management of Small
Pulmonary Nodules Detected on CT Scans: A Statement from the

## 2017-04-12 ENCOUNTER — Other Ambulatory Visit: Payer: Self-pay | Admitting: Gastroenterology

## 2017-04-24 ENCOUNTER — Other Ambulatory Visit: Payer: Self-pay | Admitting: Gastroenterology

## 2017-07-09 ENCOUNTER — Encounter: Payer: Self-pay | Admitting: Family Medicine

## 2017-07-09 ENCOUNTER — Ambulatory Visit (INDEPENDENT_AMBULATORY_CARE_PROVIDER_SITE_OTHER): Payer: Self-pay | Admitting: Family Medicine

## 2017-07-09 VITALS — BP 115/75 | HR 88 | Temp 98.4°F | Resp 17 | Ht 68.0 in | Wt 171.0 lb

## 2017-07-09 DIAGNOSIS — J029 Acute pharyngitis, unspecified: Secondary | ICD-10-CM

## 2017-07-09 LAB — POCT RAPID STREP A (OFFICE): Rapid Strep A Screen: POSITIVE — AB

## 2017-07-09 MED ORDER — PENICILLIN G BENZATHINE 1200000 UNIT/2ML IM SUSP
1.2000 10*6.[IU] | Freq: Once | INTRAMUSCULAR | Status: AC
  Administered 2017-07-09: 1.2 10*6.[IU] via INTRAMUSCULAR

## 2017-07-09 NOTE — Patient Instructions (Addendum)
IF you received an x-ray today, you will receive an invoice from [facility]. Please contact [facility] at [phone] with questions or concerns regarding your invoice.   IF you received labwork today, you will receive an invoice from [facility]. Please contact [facility] at [phone] with questions or concerns regarding your invoice.   Our billing staff will not be able to assist you with questions regarding bills from these companies.  You will be contacted with the lab results as soon as they are available. The fastest way to get your results is to activate your My Chart account. Instructions are located on the last page of this paperwork. If you have not heard from [place] regarding the results in 2 weeks, please contact this office.    We recommend that you schedule a mammogram for breast cancer screening. Typically, you do not need a referral to do this. Please contact a local imaging center to schedule your mammogram.  [facility] - [phone]  *ask for the Radiology Department [facility]) - [phone] or [phone]  [facility] - [phone] [place] [phone] [facility] - [phone]  *ask for the [facility] - [phone]  *ask for the Radiology Department [facility] - [phone]  *ask for the [facility] - [phone] Pharyngitis is redness, pain, and swelling (inflammation) of your pharynx. What are the causes? Pharyngitis is usually caused by infection. Most of the time, these infections are from viruses (viral) and are part of a cold. However, sometimes pharyngitis is caused by bacteria (bacterial). Pharyngitis can also be caused by allergies. Viral pharyngitis may be spread from person to person by coughing, sneezing, and personal items or utensils (cups, forks, spoons,  toothbrushes). Bacterial pharyngitis may be spread from person to person by more intimate contact, such as kissing. What are the signs or symptoms? Symptoms of pharyngitis include:  Sore throat.  Tiredness (fatigue).  Low-grade fever.  Headache.  Joint pain and muscle aches.  Skin rashes.  Swollen lymph nodes.  Plaque-like film on throat or tonsils (often seen with bacterial pharyngitis).  How is this diagnosed? Your health care provider will ask you questions about your illness and your symptoms. Your medical history, along with a physical exam, is often all that is needed to diagnose pharyngitis. Sometimes, a rapid strep test is done. Other lab tests may also be done, depending on the suspected cause. How is this treated? Viral pharyngitis will usually get better in 3-4 days without the use of medicine. Bacterial pharyngitis is treated with medicines that kill germs (antibiotics). Follow these instructions at home:  Drink enough water and fluids to keep your urine clear or pale yellow.  Only take over-the-counter or prescription medicines as directed by your health care provider: ? If you are prescribed antibiotics, make sure you finish them even if you start to feel better. ? Do not take aspirin.  Get lots of rest.  Gargle with 8 oz of salt water ( tsp of salt per 1 qt of water) as often as every 1-2 hours to soothe your throat.  Throat lozenges (if you are not at risk for choking) or sprays may be used to soothe your throat. Contact a health care provider if:  You have large, tender lumps in your neck.  You have a rash.  You cough up green, yellow-brown, or bloody spit. Get help right  away if:  Your neck becomes stiff.  You drool or are unable to swallow liquids.  You vomit or are unable to keep medicines or liquids down.  You have severe pain that does not go away with the use of recommended medicines.  You have trouble breathing (not caused by a stuffy  nose). This information is not intended to replace advice given to you by your health care provider. Make sure you discuss any questions you have with your health care provider. Document Released: 10/16/2005 Document Revised: 03/23/2016 Document Reviewed: 06/23/2013 Elsevier Interactive Patient Education  2017 [employer].

## 2017-07-09 NOTE — Progress Notes (Signed)
Chief Complaint  Patient presents with  . Sore Throat    onset 2 days, warm to touch, taking tylenol/ibuprofen for pain/fevers    HPI   Sore Throat: Patient complains of sore throat. Associated symptoms include chills, enlarged tonsils, myalgias, pain while swallowing, post nasal drip, sinus and nasal congestion and sore throat.Onset of symptoms was 2 days ago, gradually worsening since that time. She is drinking plenty of fluids. She does not have had recent close exposure to someone with proven streptococcal pharyngitis.   Past Medical History:  Diagnosis Date  . Asthma   . GERD (gastroesophageal reflux disease)     Current Outpatient Prescriptions  Medication Sig Dispense Refill  . ibuprofen (ADVIL,MOTRIN) 800 MG tablet Take 1 tablet (800 mg total) by mouth every 8 (eight) hours as needed. 30 tablet 5  . pantoprazole (PROTONIX) 40 MG tablet TAKE 1 TABLET(40 MG) BY MOUTH DAILY 30 tablet 0  . Prenat-FeCbn-FeAspGl-FA-Omega (OB COMPLETE PETITE) 35-5-1-200 MG CAPS Take 1 tablet by mouth daily. 30 capsule 12  . ranitidine (ZANTAC) 150 MG capsule Take 150 mg by mouth 2 (two) times daily.    Marland Kitchen amitriptyline (ELAVIL) 25 MG tablet Take 1 tablet (25 mg total) by mouth at bedtime. (Patient not taking: Reported on 09/12/2016) 30 tablet 3   Current Facility-Administered Medications  Medication Dose Route Frequency Provider Last Rate Last Dose  . penicillin g benzathine (BICILLIN LA) 1200000 UNIT/2ML injection 1.2 Million Units  1.2 Million Units Intramuscular Once Forrest Moron, MD        Allergies: No Known Allergies  Past Surgical History:  Procedure Laterality Date  . CHOLECYSTECTOMY    . RADIOACTIVE SEED GUIDED EXCISIONAL BREAST BIOPSY Right 11/25/2015   Procedure: RADIOACTIVE SEED GUIDED EXCISIONAL BREAST BIOPSY;  Surgeon: Stark Klein, MD;  Location: [place];  Service: General;  Laterality: Right;    Social History   Social History  . Marital status: Married     Spouse name: N/A  . Number of children: 1  . Years of education: N/A   Social History Main Topics  . Smoking status: Never Smoker  . Smokeless tobacco: Never Used  . Alcohol use No  . Drug use: No  . Sexual activity: Not Currently    Partners: Male    Birth control/ protection: None   Other Topics Concern  . None   Social History Narrative   Marital status: married      Children: 1 child (9)      Moved from [place] in 2016    ROS Review of Systems See HPI Constitution: see hpi No malaise No diaphoresis Skin: No rash or itching Eyes: no blurry vision, no double vision GU: no dysuria or hematuria Neuro: no dizziness or headaches  Objective: Vitals:   07/09/17 1141  BP: 115/75  Pulse: 88  Resp: 17  Temp: 98.4 F (36.9 C)  TempSrc: Oral  SpO2: 98%  Weight: 171 lb (77.6 kg)  Height: 5\' 8"  (1.727 m)    Physical Exam  General: alert, oriented, in NAD Head: normocephalic, atraumatic, no sinus tenderness Eyes: EOM intact, no scleral icterus or conjunctival injection Ears: TM clear bilaterally Nose: mucosa nonerythematous, nonedematous Throat: + pharyngeal exudate + erythema Lymph: no posterior auricular, submental + cervical lymph adenopathy Heart: normal rate, normal sinus rhythm, no murmurs Lungs: clear to auscultation bilaterally, no wheezing  Rapid strep positive  Assessment and Plan Makendra was seen today for sore throat.  Diagnoses and all orders for this visit:  Acute pharyngitis, unspecified etiology -     POCT rapid strep A -     penicillin g benzathine (BICILLIN LA) 1200000 UNIT/2ML injection 1.2 Million Units; Inject 2 mLs (1.2 Million Units total) into the muscle once.  Other orders -     Cancel: Tdap vaccine greater than or equal to 7yo IM -     Cancel: Flu Vaccine QUAD 36+ mos IM -     Cancel: Flu Vaccine QUAD 36+ mos IM -     Cancel: Tdap vaccine greater than or equal to 7yo IM   Treated with penicillin in the office today  Evan

## 2017-07-17 ENCOUNTER — Other Ambulatory Visit: Payer: Self-pay | Admitting: Gastroenterology

## 2017-10-01 ENCOUNTER — Other Ambulatory Visit: Payer: Self-pay | Admitting: Obstetrics and Gynecology

## 2017-10-01 DIAGNOSIS — Z1231 Encounter for screening mammogram for malignant neoplasm of breast: Secondary | ICD-10-CM

## 2017-10-17 ENCOUNTER — Other Ambulatory Visit: Payer: Self-pay | Admitting: Gastroenterology

## 2017-10-31 ENCOUNTER — Ambulatory Visit: Payer: Self-pay

## 2017-10-31 ENCOUNTER — Ambulatory Visit
Admission: RE | Admit: 2017-10-31 | Discharge: 2017-10-31 | Disposition: A | Discharge status: Home / Self Care | Payer: Medicaid Other | Source: Ambulatory Visit | Attending: Obstetrics and Gynecology | Admitting: Obstetrics and Gynecology

## 2017-10-31 ENCOUNTER — Other Ambulatory Visit: Payer: Self-pay | Admitting: Gastroenterology

## 2017-10-31 DIAGNOSIS — Z1231 Encounter for screening mammogram for malignant neoplasm of breast: Secondary | ICD-10-CM

## 2017-11-23 DIAGNOSIS — H9203 Otalgia, bilateral: Secondary | ICD-10-CM | POA: Diagnosis not present

## 2017-11-23 DIAGNOSIS — H8113 Benign paroxysmal vertigo, bilateral: Secondary | ICD-10-CM | POA: Diagnosis not present

## 2017-11-27 ENCOUNTER — Other Ambulatory Visit: Payer: Self-pay | Admitting: Gastroenterology

## 2017-11-28 DIAGNOSIS — N971 Female infertility of tubal origin: Secondary | ICD-10-CM | POA: Diagnosis not present

## 2017-11-28 DIAGNOSIS — Z8759 Personal history of other complications of pregnancy, childbirth and the puerperium: Secondary | ICD-10-CM | POA: Diagnosis not present

## 2017-11-28 DIAGNOSIS — N979 Female infertility, unspecified: Secondary | ICD-10-CM | POA: Diagnosis not present

## 2018-01-18 ENCOUNTER — Encounter: Payer: Self-pay | Admitting: *Deleted

## 2018-02-19 DIAGNOSIS — J3089 Other allergic rhinitis: Secondary | ICD-10-CM | POA: Diagnosis not present

## 2018-02-20 ENCOUNTER — Encounter: Payer: Self-pay | Admitting: Emergency Medicine

## 2018-02-20 ENCOUNTER — Other Ambulatory Visit: Payer: Self-pay

## 2018-02-20 ENCOUNTER — Ambulatory Visit (INDEPENDENT_AMBULATORY_CARE_PROVIDER_SITE_OTHER): Payer: BLUE CROSS/BLUE SHIELD | Admitting: Emergency Medicine

## 2018-02-20 VITALS — BP 116/62 | HR 72 | Temp 98.3°F | Resp 16 | Ht 67.0 in | Wt 181.8 lb

## 2018-02-20 DIAGNOSIS — Z0000 Encounter for general adult medical examination without abnormal findings: Secondary | ICD-10-CM | POA: Diagnosis not present

## 2018-02-20 NOTE — Patient Instructions (Addendum)
   IF you received an x-ray today, you will receive an invoice from [facility]. Please contact [facility] at [phone] with questions or concerns regarding your invoice.   IF you received labwork today, you will receive an invoice from [facility]. Please contact [facility] at [phone] with questions or concerns regarding your invoice.   Our billing staff will not be able to assist you with questions regarding bills from these companies.  You will be contacted with the lab results as soon as they are available. The fastest way to get your results is to activate your My Chart account. Instructions are located on the last page of this paperwork. If you have not heard from us regarding the results in 2 weeks, please contact this office.     Health Maintenance, Female Adopting a healthy lifestyle and getting preventive care can go a long way to promote health and wellness. Talk with your health care provider about what schedule of regular examinations is right for you. This is a good chance for you to check in with your provider about disease prevention and staying healthy. In between checkups, there are plenty of things you can do on your own. Experts have done a lot of research about which lifestyle changes and preventive measures are most likely to keep you healthy. Ask your health care provider for more information. Weight and diet Eat a healthy diet  Be sure to include plenty of vegetables, fruits, low-fat dairy products, and lean protein.  Do not eat a lot of foods high in solid fats, added sugars, or salt.  Get regular exercise. This is one of the most important things you can do for your health. ? Most adults should exercise for at least 150 minutes each week. The exercise should increase your heart rate and make you sweat (moderate-intensity exercise). ? Most adults should also do strengthening exercises at least twice a week. This is in addition to the  moderate-intensity exercise.  Maintain a healthy weight  Body mass index (BMI) is a measurement that can be used to identify possible weight problems. It estimates body fat based on height and weight. Your health care provider can help determine your BMI and help you achieve or maintain a healthy weight.  For females 20 years of age and older: ? A BMI below 18.5 is considered underweight. ? A BMI of 18.5 to 24.9 is normal. ? A BMI of 25 to 29.9 is considered overweight. ? A BMI of 30 and above is considered obese.  Watch levels of cholesterol and blood lipids  You should start having your blood tested for lipids and cholesterol at 43 years of age, then have this test every 5 years.  You may need to have your cholesterol levels checked more often if: ? Your lipid or cholesterol levels are high. ? You are older than 43 years of age. ? You are at high risk for heart disease.  Cancer screening Lung Cancer  Lung cancer screening is recommended for adults 55-80 years old who are at high risk for lung cancer because of a history of smoking.  A yearly low-dose CT scan of the lungs is recommended for people who: ? Currently smoke. ? Have quit within the past 15 years. ? Have at least a 30-pack-year history of smoking. A pack year is smoking an average of one pack of cigarettes a day for 1 year.  Yearly screening should continue until it has been 15 years since you quit.  Yearly   screening should stop if you develop a health problem that would prevent you from having lung cancer treatment.  Breast Cancer  Practice breast self-awareness. This means understanding how your breasts normally appear and feel.  It also means doing regular breast self-exams. Let your health care provider know about any changes, no matter how small.  If you are in your 20s or 30s, you should have a clinical breast exam (CBE) by a health care provider every 1-3 years as part of a regular health exam.  If you  are 73 or older, have a CBE every year. Also consider having a breast X-ray (mammogram) every year.  If you have a family history of breast cancer, talk to your health care provider about genetic screening.  If you are at high risk for breast cancer, talk to your health care provider about having an MRI and a mammogram every year.  Breast cancer gene (BRCA) assessment is recommended for women who have family members with BRCA-related cancers. BRCA-related cancers include: ? Breast. ? Ovarian. ? Tubal. ? Peritoneal cancers.  Results of the assessment will determine the need for genetic counseling and BRCA1 and BRCA2 testing.  Cervical Cancer Your health care provider may recommend that you be screened regularly for cancer of the pelvic organs (ovaries, uterus, and vagina). This screening involves a pelvic examination, including checking for microscopic changes to the surface of your cervix (Pap test). You may be encouraged to have this screening done every 3 years, beginning at age 61.  For women ages 90-65, health care providers may recommend pelvic exams and Pap testing every 3 years, or they may recommend the Pap and pelvic exam, combined with testing for human papilloma virus (HPV), every 5 years. Some types of HPV increase your risk of cervical cancer. Testing for HPV may also be done on women of any age with unclear Pap test results.  Other health care providers may not recommend any screening for nonpregnant women who are considered low risk for pelvic cancer and who do not have symptoms. Ask your health care provider if a screening pelvic exam is right for you.  If you have had past treatment for cervical cancer or a condition that could lead to cancer, you need Pap tests and screening for cancer for at least 20 years after your treatment. If Pap tests have been discontinued, your risk factors (such as having a new sexual partner) need to be reassessed to determine if screening should  resume. Some women have medical problems that increase the chance of getting cervical cancer. In these cases, your health care provider may recommend more frequent screening and Pap tests.  Colorectal Cancer  This type of cancer can be detected and often prevented.  Routine colorectal cancer screening usually begins at 43 years of age and continues through 43 years of age.  Your health care provider may recommend screening at an earlier age if you have risk factors for colon cancer.  Your health care provider may also recommend using home test kits to check for hidden blood in the stool.  A small camera at the end of a tube can be used to examine your colon directly (sigmoidoscopy or colonoscopy). This is done to check for the earliest forms of colorectal cancer.  Routine screening usually begins at age 67.  Direct examination of the colon should be repeated every 5-10 years through 43 years of age. However, you may need to be screened more often if early forms of precancerous polyps  or small growths are found.  Skin Cancer  Check your skin from head to toe regularly.  Tell your health care provider about any new moles or changes in moles, especially if there is a change in a mole's shape or color.  Also tell your health care provider if you have a mole that is larger than the size of a pencil eraser.  Always use sunscreen. Apply sunscreen liberally and repeatedly throughout the day.  Protect yourself by wearing long sleeves, pants, a wide-brimmed hat, and sunglasses whenever you are outside.  Heart disease, diabetes, and high blood pressure  High blood pressure causes heart disease and increases the risk of stroke. High blood pressure is more likely to develop in: ? People who have blood pressure in the high end of the normal range (130-139/85-89 mm Hg). ? People who are overweight or obese. ? People who are African American.  If you are 18-39 years of age, have your blood  pressure checked every 3-5 years. If you are 40 years of age or older, have your blood pressure checked every year. You should have your blood pressure measured twice-once when you are at a hospital or clinic, and once when you are not at a hospital or clinic. Record the average of the two measurements. To check your blood pressure when you are not at a hospital or clinic, you can use: ? An automated blood pressure machine at a pharmacy. ? A home blood pressure monitor.  If you are between 55 years and 79 years old, ask your health care provider if you should take aspirin to prevent strokes.  Have regular diabetes screenings. This involves taking a blood sample to check your fasting blood sugar level. ? If you are at a normal weight and have a low risk for diabetes, have this test once every three years after 43 years of age. ? If you are overweight and have a high risk for diabetes, consider being tested at a younger age or more often. Preventing infection Hepatitis B  If you have a higher risk for hepatitis B, you should be screened for this virus. You are considered at high risk for hepatitis B if: ? You were born in a country where hepatitis B is common. Ask your health care provider which countries are considered high risk. ? Your parents were born in a high-risk country, and you have not been immunized against hepatitis B (hepatitis B vaccine). ? You have HIV or AIDS. ? You use needles to inject street drugs. ? You live with someone who has hepatitis B. ? You have had sex with someone who has hepatitis B. ? You get hemodialysis treatment. ? You take certain medicines for conditions, including cancer, organ transplantation, and autoimmune conditions.  Hepatitis C  Blood testing is recommended for: ? Everyone born from 1945 through 1965. ? Anyone with known risk factors for hepatitis C.  Sexually transmitted infections (STIs)  You should be screened for sexually transmitted  infections (STIs) including gonorrhea and chlamydia if: ? You are sexually active and are younger than 43 years of age. ? You are older than 43 years of age and your health care provider tells you that you are at risk for this type of infection. ? Your sexual activity has changed since you were last screened and you are at an increased risk for chlamydia or gonorrhea. Ask your health care provider if you are at risk.  If you do not have HIV, but are at risk,   it may be recommended that you take a prescription medicine daily to prevent HIV infection. This is called pre-exposure prophylaxis (PrEP). You are considered at risk if: ? You are sexually active and do not regularly use condoms or know the HIV status of your partner(s). ? You take drugs by injection. ? You are sexually active with a partner who has HIV.  Talk with your health care provider about whether you are at high risk of being infected with HIV. If you choose to begin PrEP, you should first be tested for HIV. You should then be tested every 3 months for as long as you are taking PrEP. Pregnancy  If you are premenopausal and you may become pregnant, ask your health care provider about preconception counseling.  If you may become pregnant, take 400 to 800 micrograms (mcg) of folic acid every day.  If you want to prevent pregnancy, talk to your health care provider about birth control (contraception). Osteoporosis and menopause  Osteoporosis is a disease in which the bones lose minerals and strength with aging. This can result in serious bone fractures. Your risk for osteoporosis can be identified using a bone density scan.  If you are 34 years of age or older, or if you are at risk for osteoporosis and fractures, ask your health care provider if you should be screened.  Ask your health care provider whether you should take a calcium or vitamin D supplement to lower your risk for osteoporosis.  Menopause may have certain physical  symptoms and risks.  Hormone replacement therapy may reduce some of these symptoms and risks. Talk to your health care provider about whether hormone replacement therapy is right for you. Follow these instructions at home:  Schedule regular health, dental, and eye exams.  Stay current with your immunizations.  Do not use any tobacco products including cigarettes, chewing tobacco, or electronic cigarettes.  If you are pregnant, do not drink alcohol.  If you are breastfeeding, limit how much and how often you drink alcohol.  Limit alcohol intake to no more than 1 drink per day for nonpregnant women. One drink equals 12 ounces of beer, 5 ounces of wine, or 1 ounces of hard liquor.  Do not use street drugs.  Do not share needles.  Ask your health care provider for help if you need support or information about quitting drugs.  Tell your health care provider if you often feel depressed.  Tell your health care provider if you have ever been abused or do not feel safe at home. This information is not intended to replace advice given to you by your health care provider. Make sure you discuss any questions you have with your health care provider. Document Released: 05/01/2011 Document Revised: 03/23/2016 Document Reviewed: 07/20/2015 [employer]vier Interactive Patient Education  [employer].

## 2018-02-20 NOTE — Progress Notes (Signed)
Tonya Summers 42 y.o.   Chief Complaint  Patient presents with  . Annual Exam    Wellness Exam for work    HISTORY OF PRESENT ILLNESS: This is a 42 y.o. female here for annual exam required by work.  Was recently seen by GYN Dr. had mammogram and Pap smear.  All normal.  States she recently also had complete blood work that looked normal.  She has no chronic medical problems.  No medical concerns no complaints at this time.  HPI   Prior to Admission medications   Medication Sig Start Date End Date Taking? Authorizing Provider  pantoprazole (PROTONIX) 40 MG tablet TAKE 1 TABLET(40 MG) BY MOUTH DAILY 11/28/17  Yes Nandigam, Kavitha V, MD  Prenat-FeCbn-FeAspGl-FA-Omega (OB COMPLETE PETITE) 35-5-1-200 MG CAPS Take 1 tablet by mouth daily. 12/13/16  Yes Shelly Bombard, MD  ranitidine (ZANTAC) 150 MG capsule Take 150 mg by mouth 2 (two) times daily.   Yes [provider]  amitriptyline (ELAVIL) 25 MG tablet Take 1 tablet (25 mg total) by mouth at bedtime. Patient not taking: Reported on 09/12/2016 03/20/16   Mauri Pole, MD  ibuprofen (ADVIL,MOTRIN) 800 MG tablet Take 1 tablet (800 mg total) by mouth every 8 (eight) hours as needed. Patient not taking: Reported on 02/20/2018 12/13/16   Shelly Bombard, MD    No Known Allergies  Patient Active Problem List   Diagnosis Date Noted  . 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 Gastroparesis 05/24/2015  . Nausea without vomiting 01/29/2015  . GERD (gastroesophageal reflux disease) 09/09/2014    Past Medical History:  Diagnosis Date  . Asthma   . GERD (gastroesophageal reflux disease)     Past Surgical History:  Procedure Laterality Date  . BREAST BIOPSY Right 07/2015  . BREAST EXCISIONAL BIOPSY Right 10/2015  . CHOLECYSTECTOMY    . RADIOACTIVE SEED GUIDED EXCISIONAL BREAST BIOPSY Right 11/25/2015   Procedure: RADIOACTIVE SEED GUIDED EXCISIONAL BREAST BIOPSY;  Surgeon: Stark Klein, MD;  Location: [facility];  Service: General;  Laterality: Right;    Social History   Socioeconomic History  . Marital status: Married    Spouse name: Not on file  . Number of children: 1  . Years of education: Not on file  . Highest education level: Not on file  Occupational History  . Not on file  Social Needs  . Financial resource strain: Not on file  . Food insecurity:    Worry: Not on file    Inability: Not on file  . Transportation needs:    Medical: Not on file    Non-medical: Not on file  Tobacco Use  . Smoking status: Never Smoker  . Smokeless tobacco: Never Used  Substance and Sexual Activity  . Alcohol use: No    Alcohol/week: 0.0 oz  . Drug use: No  . Sexual activity: Not Currently    Partners: Male    Birth control/protection: None  Lifestyle  . Physical activity:    Days per week: Not on file    Minutes per session: Not on file  . Stress: Not on file  Relationships  . Social connections:    Talks on phone: Not on file    Gets together: Not on file    Attends religious service: Not on file    Active member of club or organization: Not on file    Attends meetings of clubs or organizations: Not on file    Relationship status: Not on file  .  Intimate partner violence:    Fear of current or ex partner: Not on file    Emotionally abused: Not on file    Physically abused: Not on file    Forced sexual activity: Not on file  Other Topics Concern  . Not on file  Social History Narrative   Marital status: married      Children: 1 child (66)      Moved from [place] in 2016    No family history on file.   Review of Systems  Constitutional: Negative.  Negative for chills, fever and weight loss.  HENT: Negative.  Negative for hearing loss, nosebleeds and sore throat.   Eyes: Negative.  Negative for blurred vision, double vision, discharge and redness.  Respiratory: Negative.  Negative for cough and shortness of breath.   Cardiovascular: Negative.  Negative for chest pain and  palpitations.  Gastrointestinal: Negative.  Negative for abdominal pain, blood in stool, diarrhea, nausea and vomiting.  Genitourinary: Negative.  Negative for dysuria and hematuria.  Musculoskeletal: Negative.  Negative for back pain, myalgias and neck pain.  Skin: Negative.  Negative for rash.  Neurological: Negative.  Negative for dizziness and headaches.  Endo/Heme/Allergies: Negative.   All other systems reviewed and are negative.  Vitals:   02/20/18 1123  BP: 116/62  Pulse: 72  Resp: 16  Temp: 98.3 F (36.8 C)  SpO2: 99%     Physical Exam  Constitutional: She is oriented to person, place, and time. She appears well-developed and well-nourished.  HENT:  Head: Normocephalic and atraumatic.  Nose: Nose normal.  Mouth/Throat: Oropharynx is clear and moist.  Eyes: Pupils are equal, round, and reactive to light. Conjunctivae and EOM are normal.  Neck: Normal range of motion. Neck supple. No thyromegaly present.  Cardiovascular: Normal rate, regular rhythm, normal heart sounds and intact distal pulses.  Pulmonary/Chest: Effort normal and breath sounds normal. No respiratory distress.  Abdominal: Soft. Bowel sounds are normal. She exhibits no distension. There is no tenderness.  Musculoskeletal: Normal range of motion.  Lymphadenopathy:    She has no cervical adenopathy.  Neurological: She is alert and oriented to person, place, and time. No sensory deficit. She exhibits normal muscle tone.  Skin: Skin is warm and dry. Capillary refill takes less than 2 seconds. No rash noted.  Psychiatric: She has a normal mood and affect. Her behavior is normal.  Vitals reviewed.    ASSESSMENT & PLAN: [place] was seen today for annual exam.  Diagnoses and all orders for this visit:  Routine general medical examination at a health care facility   Patient Instructions       IF you received an x-ray today, you will receive an invoice from [facility]. Please contact  [facility] at [phone] with questions or concerns regarding your invoice.   IF you received labwork today, you will receive an invoice from [facility]. Please contact [facility] at [phone] with questions or concerns regarding your invoice.   Our billing staff will not be able to assist you with questions regarding bills from these companies.  You will be contacted with the lab results as soon as they are available. The fastest way to get your results is to activate your My Chart account. Instructions are located on the last page of this paperwork. If you have not heard from [place] regarding the results in 2 weeks, please contact this office.     Health Maintenance, Female Adopting a healthy lifestyle and getting preventive care can go a long  way to promote health and wellness. Talk with your health care provider about what schedule of regular examinations is right for you. This is a good chance for you to check in with your provider about disease prevention and staying healthy. In between checkups, there are plenty of things you can do on your own. Experts have done a lot of research about which lifestyle changes and preventive measures are most likely to keep you healthy. Ask your health care provider for more information. Weight and diet Eat a healthy diet  Be sure to include plenty of vegetables, fruits, low-fat dairy products, and lean protein.  Do not eat a lot of foods high in solid fats, added sugars, or salt.  Get regular exercise. This is one of the most important things you can do for your health. ? Most adults should exercise for at least 150 minutes each week. The exercise should increase your heart rate and make you sweat (moderate-intensity exercise). ? Most adults should also do strengthening exercises at least twice a week. This is in addition to the moderate-intensity exercise.  Maintain a healthy weight  Body mass index (BMI) is a measurement that can be used to  identify possible weight problems. It estimates body fat based on height and weight. Your health care provider can help determine your BMI and help you achieve or maintain a healthy weight.  For females 8 years of age and older: ? A BMI below 18.5 is considered underweight. ? A BMI of 18.5 to 24.9 is normal. ? A BMI of 25 to 29.9 is considered overweight. ? A BMI of 30 and above is considered obese.  Watch levels of cholesterol and blood lipids  You should start having your blood tested for lipids and cholesterol at 43 years of age, then have this test every 5 years.  You may need to have your cholesterol levels checked more often if: ? Your lipid or cholesterol levels are high. ? You are older than 43 years of age. ? You are at high risk for heart disease.  Cancer screening Lung Cancer  Lung cancer screening is recommended for adults 46-37 years old who are at high risk for lung cancer because of a history of smoking.  A yearly low-dose CT scan of the lungs is recommended for people who: ? Currently smoke. ? Have quit within the past 15 years. ? Have at least a 30-pack-year history of smoking. A pack year is smoking an average of one pack of cigarettes a day for 1 year.  Yearly screening should continue until it has been 15 years since you quit.  Yearly screening should stop if you develop a health problem that would prevent you from having lung cancer treatment.  Breast Cancer  Practice breast self-awareness. This means understanding how your breasts normally appear and feel.  It also means doing regular breast self-exams. Let your health care provider know about any changes, no matter how small.  If you are in your 20s or 30s, you should have a clinical breast exam (CBE) by a health care provider every 1-3 years as part of a regular health exam.  If you are 23 or older, have a CBE every year. Also consider having a breast X-ray (mammogram) every year.  If you have a  family history of breast cancer, talk to your health care provider about genetic screening.  If you are at high risk for breast cancer, talk to your health care provider about having an MRI and  a mammogram every year.  Breast cancer gene (BRCA) assessment is recommended for women who have family members with BRCA-related cancers. BRCA-related cancers include: ? Breast. ? Ovarian. ? Tubal. ? Peritoneal cancers.  Results of the assessment will determine the need for genetic counseling and BRCA1 and BRCA2 testing.  Cervical Cancer Your health care provider may recommend that you be screened regularly for cancer of the pelvic organs (ovaries, uterus, and vagina). This screening involves a pelvic examination, including checking for microscopic changes to the surface of your cervix (Pap test). You may be encouraged to have this screening done every 3 years, beginning at age 69.  For women ages 80-65, health care providers may recommend pelvic exams and Pap testing every 3 years, or they may recommend the Pap and pelvic exam, combined with testing for human papilloma virus (HPV), every 5 years. Some types of HPV increase your risk of cervical cancer. Testing for HPV may also be done on women of any age with unclear Pap test results.  Other health care providers may not recommend any screening for nonpregnant women who are considered low risk for pelvic cancer and who do not have symptoms. Ask your health care provider if a screening pelvic exam is right for you.  If you have had past treatment for cervical cancer or a condition that could lead to cancer, you need Pap tests and screening for cancer for at least 20 years after your treatment. If Pap tests have been discontinued, your risk factors (such as having a new sexual partner) need to be reassessed to determine if screening should resume. Some women have medical problems that increase the chance of getting cervical cancer. In these cases, your  health care provider may recommend more frequent screening and Pap tests.  Colorectal Cancer  This type of cancer can be detected and often prevented.  Routine colorectal cancer screening usually begins at 43 years of age and continues through 43 years of age.  Your health care provider may recommend screening at an earlier age if you have risk factors for colon cancer.  Your health care provider may also recommend using home test kits to check for hidden blood in the stool.  A small camera at the end of a tube can be used to examine your colon directly (sigmoidoscopy or colonoscopy). This is done to check for the earliest forms of colorectal cancer.  Routine screening usually begins at age 61.  Direct examination of the colon should be repeated every 5-10 years through 43 years of age. However, you may need to be screened more often if early forms of precancerous polyps or small growths are found.  Skin Cancer  Check your skin from head to toe regularly.  Tell your health care provider about any new moles or changes in moles, especially if there is a change in a mole's shape or color.  Also tell your health care provider if you have a mole that is larger than the size of a pencil eraser.  Always use sunscreen. Apply sunscreen liberally and repeatedly throughout the day.  Protect yourself by wearing long sleeves, pants, a wide-brimmed hat, and sunglasses whenever you are outside.  Heart disease, diabetes, and high blood pressure  High blood pressure causes heart disease and increases the risk of stroke. High blood pressure is more likely to develop in: ? People who have blood pressure in the high end of the normal range (130-139/85-89 mm Hg). ? People who are overweight or obese. ? People  who are African American.  If you are 34-16 years of age, have your blood pressure checked every 3-5 years. If you are 54 years of age or older, have your blood pressure checked every year. You  should have your blood pressure measured twice-once when you are at a hospital or clinic, and once when you are not at a hospital or clinic. Record the average of the two measurements. To check your blood pressure when you are not at a hospital or clinic, you can use: ? An automated blood pressure machine at a pharmacy. ? A home blood pressure monitor.  If you are between 25 years and 69 years old, ask your health care provider if you should take aspirin to prevent strokes.  Have regular diabetes screenings. This involves taking a blood sample to check your fasting blood sugar level. ? If you are at a normal weight and have a low risk for diabetes, have this test once every three years after 43 years of age. ? If you are overweight and have a high risk for diabetes, consider being tested at a younger age or more often. Preventing infection Hepatitis B  If you have a higher risk for hepatitis B, you should be screened for this virus. You are considered at high risk for hepatitis B if: ? You were born in a country where hepatitis B is common. Ask your health care provider which countries are considered high risk. ? Your parents were born in a high-risk country, and you have not been immunized against hepatitis B (hepatitis B vaccine). ? You have HIV or AIDS. ? You use needles to inject street drugs. ? You live with someone who has hepatitis B. ? You have had sex with someone who has hepatitis B. ? You get hemodialysis treatment. ? You take certain medicines for conditions, including cancer, organ transplantation, and autoimmune conditions.  Hepatitis C  Blood testing is recommended for: ? Everyone born from [birth year] through 1966. ? Anyone with known risk factors for hepatitis C.  Sexually transmitted infections (STIs)  You should be screened for sexually transmitted infections (STIs) including gonorrhea and chlamydia if: ? You are sexually active and are younger than 43 years of age. ? You  are older than 43 years of age and your health care provider tells you that you are at risk for this type of infection. ? Your sexual activity has changed since you were last screened and you are at an increased risk for chlamydia or gonorrhea. Ask your health care provider if you are at risk.  If you do not have HIV, but are at risk, it may be recommended that you take a prescription medicine daily to prevent HIV infection. This is called pre-exposure prophylaxis (PrEP). You are considered at risk if: ? You are sexually active and do not regularly use condoms or know the HIV status of your partner(s). ? You take drugs by injection. ? You are sexually active with a partner who has HIV.  Talk with your health care provider about whether you are at high risk of being infected with HIV. If you choose to begin PrEP, you should first be tested for HIV. You should then be tested every 3 months for as long as you are taking PrEP. Pregnancy  If you are premenopausal and you may become pregnant, ask your health care provider about preconception counseling.  If you may become pregnant, take 400 to 800 micrograms (mcg) of folic acid every day.  If you  want to prevent pregnancy, talk to your health care provider about birth control (contraception). Osteoporosis and menopause  Osteoporosis is a disease in which the bones lose minerals and strength with aging. This can result in serious bone fractures. Your risk for osteoporosis can be identified using a bone density scan.  If you are 69 years of age or older, or if you are at risk for osteoporosis and fractures, ask your health care provider if you should be screened.  Ask your health care provider whether you should take a calcium or vitamin D supplement to lower your risk for osteoporosis.  Menopause may have certain physical symptoms and risks.  Hormone replacement therapy may reduce some of these symptoms and risks. Talk to your health care  provider about whether hormone replacement therapy is right for you. Follow these instructions at home:  Schedule regular health, dental, and eye exams.  Stay current with your immunizations.  Do not use any tobacco products including cigarettes, chewing tobacco, or electronic cigarettes.  If you are pregnant, do not drink alcohol.  If you are breastfeeding, limit how much and how often you drink alcohol.  Limit alcohol intake to no more than 1 drink per day for nonpregnant women. One drink equals 12 ounces of beer, 5 ounces of wine, or 1 ounces of hard liquor.  Do not use street drugs.  Do not share needles.  Ask your health care provider for help if you need support or information about quitting drugs.  Tell your health care provider if you often feel depressed.  Tell your health care provider if you have ever been abused or do not feel safe at home. This information is not intended to replace advice given to you by your health care provider. Make sure you discuss any questions you have with your health care provider. Document Released: 05/01/2011 Document Revised: 03/23/2016 Document Reviewed: 07/20/2015 Elsevier Interactive Patient Education  2018 [employer]vier Inc.      Agustina Caroli, MD [facility]

## 2018-03-03 DIAGNOSIS — R05 Cough: Secondary | ICD-10-CM | POA: Diagnosis not present

## 2018-04-28 DIAGNOSIS — Z3200 Encounter for pregnancy test, result unknown: Secondary | ICD-10-CM | POA: Diagnosis not present

## 2018-09-09 DIAGNOSIS — Z3161 Procreative counseling and advice using natural family planning: Secondary | ICD-10-CM | POA: Diagnosis not present

## 2018-09-09 DIAGNOSIS — E2839 Other primary ovarian failure: Secondary | ICD-10-CM | POA: Diagnosis not present

## 2018-09-09 DIAGNOSIS — N96 Recurrent pregnancy loss: Secondary | ICD-10-CM | POA: Diagnosis not present

## 2018-09-09 DIAGNOSIS — N971 Female infertility of tubal origin: Secondary | ICD-10-CM | POA: Diagnosis not present

## 2018-09-21 DIAGNOSIS — N96 Recurrent pregnancy loss: Secondary | ICD-10-CM | POA: Diagnosis not present

## 2018-09-21 DIAGNOSIS — Z3141 Encounter for fertility testing: Secondary | ICD-10-CM | POA: Diagnosis not present

## 2018-09-21 DIAGNOSIS — N840 Polyp of corpus uteri: Secondary | ICD-10-CM | POA: Diagnosis not present

## 2018-10-14 DIAGNOSIS — D259 Leiomyoma of uterus, unspecified: Secondary | ICD-10-CM | POA: Diagnosis not present

## 2018-10-14 DIAGNOSIS — N840 Polyp of corpus uteri: Secondary | ICD-10-CM | POA: Diagnosis not present

## 2018-10-14 DIAGNOSIS — D250 Submucous leiomyoma of uterus: Secondary | ICD-10-CM | POA: Diagnosis not present

## 2018-11-12 DIAGNOSIS — E2839 Other primary ovarian failure: Secondary | ICD-10-CM | POA: Diagnosis not present

## 2018-11-12 DIAGNOSIS — Z3161 Procreative counseling and advice using natural family planning: Secondary | ICD-10-CM | POA: Diagnosis not present

## 2018-11-12 DIAGNOSIS — N96 Recurrent pregnancy loss: Secondary | ICD-10-CM | POA: Diagnosis not present

## 2018-11-12 DIAGNOSIS — N971 Female infertility of tubal origin: Secondary | ICD-10-CM | POA: Diagnosis not present

## 2018-12-25 DIAGNOSIS — R69 Illness, unspecified: Secondary | ICD-10-CM | POA: Diagnosis not present

## 2019-03-10 DIAGNOSIS — E2839 Other primary ovarian failure: Secondary | ICD-10-CM | POA: Diagnosis not present

## 2019-03-10 DIAGNOSIS — N96 Recurrent pregnancy loss: Secondary | ICD-10-CM | POA: Diagnosis not present

## 2019-03-10 DIAGNOSIS — Z3161 Procreative counseling and advice using natural family planning: Secondary | ICD-10-CM | POA: Diagnosis not present

## 2019-03-10 DIAGNOSIS — N971 Female infertility of tubal origin: Secondary | ICD-10-CM | POA: Diagnosis not present

## 2019-03-17 DIAGNOSIS — N971 Female infertility of tubal origin: Secondary | ICD-10-CM | POA: Diagnosis not present

## 2019-03-17 DIAGNOSIS — E2839 Other primary ovarian failure: Secondary | ICD-10-CM | POA: Diagnosis not present

## 2019-03-17 DIAGNOSIS — N83292 Other ovarian cyst, left side: Secondary | ICD-10-CM | POA: Diagnosis not present

## 2019-03-17 DIAGNOSIS — Z3161 Procreative counseling and advice using natural family planning: Secondary | ICD-10-CM | POA: Diagnosis not present

## 2019-03-28 DIAGNOSIS — N971 Female infertility of tubal origin: Secondary | ICD-10-CM | POA: Diagnosis not present

## 2019-03-28 DIAGNOSIS — E2839 Other primary ovarian failure: Secondary | ICD-10-CM | POA: Diagnosis not present

## 2019-03-28 DIAGNOSIS — N83292 Other ovarian cyst, left side: Secondary | ICD-10-CM | POA: Diagnosis not present

## 2019-03-28 DIAGNOSIS — N96 Recurrent pregnancy loss: Secondary | ICD-10-CM | POA: Diagnosis not present

## 2019-04-04 DIAGNOSIS — N83292 Other ovarian cyst, left side: Secondary | ICD-10-CM | POA: Diagnosis not present

## 2019-04-04 DIAGNOSIS — N96 Recurrent pregnancy loss: Secondary | ICD-10-CM | POA: Diagnosis not present

## 2019-04-04 DIAGNOSIS — N971 Female infertility of tubal origin: Secondary | ICD-10-CM | POA: Diagnosis not present

## 2019-04-04 DIAGNOSIS — Z3161 Procreative counseling and advice using natural family planning: Secondary | ICD-10-CM | POA: Diagnosis not present

## 2019-04-04 DIAGNOSIS — E2839 Other primary ovarian failure: Secondary | ICD-10-CM | POA: Diagnosis not present

## 2019-04-11 DIAGNOSIS — E2839 Other primary ovarian failure: Secondary | ICD-10-CM | POA: Diagnosis not present

## 2019-04-11 DIAGNOSIS — N96 Recurrent pregnancy loss: Secondary | ICD-10-CM | POA: Diagnosis not present

## 2019-04-11 DIAGNOSIS — Z319 Encounter for procreative management, unspecified: Secondary | ICD-10-CM | POA: Diagnosis not present

## 2019-04-11 DIAGNOSIS — N971 Female infertility of tubal origin: Secondary | ICD-10-CM | POA: Diagnosis not present

## 2019-04-11 DIAGNOSIS — N83292 Other ovarian cyst, left side: Secondary | ICD-10-CM | POA: Diagnosis not present

## 2019-04-24 DIAGNOSIS — M654 Radial styloid tenosynovitis [de Quervain]: Secondary | ICD-10-CM | POA: Diagnosis not present

## 2019-04-25 DIAGNOSIS — M654 Radial styloid tenosynovitis [de Quervain]: Secondary | ICD-10-CM | POA: Diagnosis not present

## 2020-04-13 DIAGNOSIS — N912 Amenorrhea, unspecified: Secondary | ICD-10-CM | POA: Diagnosis not present

## 2020-05-04 ENCOUNTER — Encounter (HOSPITAL_COMMUNITY): Payer: Self-pay

## 2020-05-04 ENCOUNTER — Ambulatory Visit (HOSPITAL_COMMUNITY)
Admission: EM | Admit: 2020-05-04 | Discharge: 2020-05-04 | Disposition: A | Discharge status: Home / Self Care | Payer: BC Managed Care – PPO | Attending: Family Medicine | Admitting: Family Medicine

## 2020-05-04 ENCOUNTER — Other Ambulatory Visit: Payer: Self-pay

## 2020-05-04 DIAGNOSIS — Z3202 Encounter for pregnancy test, result negative: Secondary | ICD-10-CM | POA: Diagnosis not present

## 2020-05-04 DIAGNOSIS — R109 Unspecified abdominal pain: Secondary | ICD-10-CM | POA: Diagnosis not present

## 2020-05-04 DIAGNOSIS — K3184 Gastroparesis: Secondary | ICD-10-CM | POA: Diagnosis not present

## 2020-05-04 DIAGNOSIS — R1013 Epigastric pain: Secondary | ICD-10-CM | POA: Diagnosis not present

## 2020-05-04 LAB — COMPREHENSIVE METABOLIC PANEL
ALT: 13 U/L (ref 0–44)
AST: 15 U/L (ref 15–41)
Albumin: 4 g/dL (ref 3.5–5.0)
Alkaline Phosphatase: 46 U/L (ref 38–126)
Anion gap: 8 (ref 5–15)
BUN: 7 mg/dL (ref 6–20)
CO2: 28 mmol/L (ref 22–32)
Calcium: 9.7 mg/dL (ref 8.9–10.3)
Chloride: 104 mmol/L (ref 98–111)
Creatinine, Ser: 0.79 mg/dL (ref 0.44–1.00)
GFR calc Af Amer: >60 mL/min (ref >60)
GFR calc non Af Amer: >60 mL/min (ref >60)
Glucose, Bld: 103 mg/dL — ABNORMAL HIGH (ref 70–99)
Potassium: 4.1 mmol/L (ref 3.5–5.1)
Sodium: 140 mmol/L (ref 135–145)
Total Bilirubin: 1.5 mg/dL — ABNORMAL HIGH (ref 0.3–1.2)
Total Protein: 7.4 g/dL (ref 6.5–8.1)

## 2020-05-04 LAB — CBC WITH DIFFERENTIAL/PLATELET
Abs Immature Granulocytes: 0.01 10*3/uL (ref 0.00–0.07)
Basophils Absolute: 0 10*3/uL (ref 0.0–0.1)
Basophils Relative: 1 %
Eosinophils Absolute: 0 10*3/uL (ref 0.0–0.5)
Eosinophils Relative: 1 %
HCT: 38.7 % (ref 36.0–46.0)
Hemoglobin: 12.5 g/dL (ref 12.0–15.0)
Immature Granulocytes: 0 %
Lymphocytes Relative: 28 %
Lymphs Abs: 1 10*3/uL (ref 0.7–4.0)
MCH: 29 pg (ref 26.0–34.0)
MCHC: 32.3 g/dL (ref 30.0–36.0)
MCV: 89.8 fL (ref 80.0–100.0)
Monocytes Absolute: 0.3 10*3/uL (ref 0.1–1.0)
Monocytes Relative: 7 %
Neutro Abs: 2.3 10*3/uL (ref 1.7–7.7)
Neutrophils Relative %: 63 %
Platelets: 207 10*3/uL (ref 150–400)
RBC: 4.31 MIL/uL (ref 3.87–5.11)
RDW: 12.9 % (ref 11.5–15.5)
WBC: 3.6 10*3/uL — ABNORMAL LOW (ref 4.0–10.5)
nRBC: 0 % (ref 0.0–0.2)

## 2020-05-04 LAB — POCT URINALYSIS DIP (DEVICE)
Bilirubin Urine: NEGATIVE
Glucose, UA: NEGATIVE mg/dL
Hgb urine dipstick: NEGATIVE
Ketones, ur: NEGATIVE mg/dL
Leukocytes,Ua: NEGATIVE
Nitrite: NEGATIVE
Protein, ur: NEGATIVE mg/dL
Specific Gravity, Urine: 1.02 (ref 1.005–1.030)
Urobilinogen, UA: 0.2 mg/dL (ref 0.0–1.0)
pH: 7 (ref 5.0–8.0)

## 2020-05-04 LAB — POC URINE PREG, ED: Preg Test, Ur: NEGATIVE

## 2020-05-04 LAB — LIPASE, BLOOD: Lipase: 28 U/L (ref 11–51)

## 2020-05-04 MED ORDER — METOCLOPRAMIDE HCL 10 MG PO TABS
10.0000 mg | ORAL_TABLET | Freq: Three times a day (TID) | ORAL | 0 refills | Status: DC
Start: 2020-05-04 — End: 2020-05-07

## 2020-05-04 NOTE — ED Triage Notes (Signed)
Pt c/o mid-epigastric and RUQ/LUQ abdominal pain for approx 4 months after returning from [place] with increase pain the past two months. States she was evaluated for same and began taking pantoprazole approx 2 weeks ago with 25% increase "acid".  Pt states pain is worse after eating and has nausea and burping as well.  Denies fever, chills, v/d or dysuria sx.

## 2020-05-04 NOTE — Discharge Instructions (Addendum)
Blood work pending Try reglan before meals and bedtime Continue pantoprazole Follow up with gastroenterology if symptoms persisting

## 2020-05-05 NOTE — ED Provider Notes (Signed)
[facility]    CSN: [id number] Arrival date & time: 05/04/20  1328      History   Chief Complaint Chief Complaint  Patient presents with   Abdominal Pain    HPI Tonya Summers is a 44 y.o. female history of GERD presenting today for evaluation of abdominal pain.  Patient reports that discomfort in her upper abdomen as well as sensation of feeling full quickly.  She recently began taking pantoprazole over the past 2 weeks and does report that it has helped with acid and indigestion, but continues to have some upper abdominal discomfort with early satiety.  She reports that she has history of gastroparesis which is previously been diet controlled.  She has had prior EGD and colonoscopy and was noted to have this.  She does express concern over possible H. pylori.  Her symptoms worsen after eating.  Reports associated nausea, denies vomiting.  Denies diarrhea or change in bowel movements.  Has had prior cholecystectomy.  Patient reports symptoms today feel similar to when she previously had problems with gastroparesis.  HPI  Past Medical History:  Diagnosis Date   Asthma    GERD (gastroesophageal reflux disease)     Patient Active Problem List   Diagnosis Date Noted   Female circumcision 07/02/2015   Gastroparesis 05/24/2015   Nausea without vomiting 01/29/2015   GERD (gastroesophageal reflux disease) 09/09/2014    Past Surgical History:  Procedure Laterality Date   BREAST BIOPSY Right 07/2015   BREAST EXCISIONAL BIOPSY Right 10/2015   CHOLECYSTECTOMY     RADIOACTIVE SEED GUIDED EXCISIONAL BREAST BIOPSY Right 11/25/2015   Procedure: RADIOACTIVE SEED GUIDED EXCISIONAL BREAST BIOPSY;  Surgeon: Stark Klein, MD;  Location: [facility];  Service: General;  Laterality: Right;    OB History    Gravida  6   Para  1   Term  1   Preterm      AB  5   Living  1     SAB  5   TAB      Ectopic      Multiple      Live Births             Obstetric Comments  All miscarriages were at or before 10-11 weks         Home Medications    Prior to Admission medications   Medication Sig Start Date End Date Taking? Authorizing Provider  pantoprazole (PROTONIX) 40 MG tablet TAKE 1 TABLET(40 MG) BY MOUTH DAILY 11/28/17  Yes Nandigam, Venia Minks, MD  amitriptyline (ELAVIL) 25 MG tablet Take 1 tablet (25 mg total) by mouth at bedtime. Patient not taking: Reported on 09/12/2016 03/20/16   Mauri Pole, MD  ibuprofen (ADVIL,MOTRIN) 800 MG tablet Take 1 tablet (800 mg total) by mouth every 8 (eight) hours as needed. Patient not taking: Reported on 02/20/2018 12/13/16   Shelly Bombard, MD  metoCLOPramide (REGLAN) 10 MG tablet Take 1 tablet (10 mg total) by mouth 4 (four) times daily -  before meals and at bedtime. 05/04/20   Avaley Coop C, [name]  Prenat-FeCbn-FeAspGl-FA-Omega (OB COMPLETE PETITE) 35-5-1-200 MG CAPS Take 1 tablet by mouth daily. 12/13/16   Shelly Bombard, MD  ranitidine (ZANTAC) 150 MG capsule Take 150 mg by mouth 2 (two) times daily.    [provider]    Family History History reviewed. No pertinent family history.  Social History Social History   Tobacco Use   Smoking status: Never  Smoker   Smokeless tobacco: Never Used  Substance Use Topics   Alcohol use: No    Alcohol/week: 0.0 standard drinks   Drug use: No     Allergies   No known allergies   Review of Systems Review of Systems  Constitutional: Negative for fever.  Respiratory: Negative for shortness of breath.   Cardiovascular: Negative for chest pain.  Gastrointestinal: Positive for abdominal pain and nausea. Negative for diarrhea and vomiting.  Genitourinary: Negative for dysuria, flank pain, genital sores, hematuria, menstrual problem, vaginal bleeding, vaginal discharge and vaginal pain.  Musculoskeletal: Negative for back pain.  Skin: Negative for rash.  Neurological: Negative for dizziness, light-headedness and  headaches.     Physical Exam Triage Vital Signs ED Triage Vitals  Enc Vitals Group     BP 05/04/20 1453 133/74     Pulse Rate 05/04/20 1453 91     Resp 05/04/20 1453 18     Temp 05/04/20 1453 98.2 F (36.8 C)     Temp Source 05/04/20 1453 Oral     SpO2 05/04/20 1453 100 %     Weight --      Height --      Head Circumference --      Peak Flow --      Pain Score 05/04/20 1451 2     Pain Loc --      Pain Edu? --      Excl. in [place]? --    No data found.  Updated Vital Signs BP 133/74 (BP Location: Right Arm)    Pulse 91    Temp 98.2 F (36.8 C) (Oral)    Resp 18    LMP 04/22/2020    SpO2 100%   Visual Acuity Right Eye Distance:   Left Eye Distance:   Bilateral Distance:    Right Eye Near:   Left Eye Near:    Bilateral Near:     Physical Exam Vitals and nursing note reviewed.  Constitutional:      Appearance: She is well-developed.     Comments: No acute distress  HENT:     Head: Normocephalic and atraumatic.     Nose: Nose normal.  Eyes:     Conjunctiva/sclera: Conjunctivae normal.  Cardiovascular:     Rate and Rhythm: Normal rate and regular rhythm.  Pulmonary:     Effort: Pulmonary effort is normal. No respiratory distress.     Comments: Breathing comfortably at rest, CTABL, no wheezing, rales or other adventitious sounds auscultated  Abdominal:     General: There is no distension.     Comments: Soft, nondistended, tender to palpation in epigastrium, nontender to palpation throughout lower quadrants of abdomen, negative rebound, negative Rovsing, negative McBurney's, negative Murphy's  Musculoskeletal:        General: Normal range of motion.     Cervical back: Neck supple.  Skin:    General: Skin is warm and dry.  Neurological:     Mental Status: She is alert and oriented to person, place, and time.      UC Treatments / Results  Labs (all labs ordered are listed, but only abnormal results are displayed) Labs Reviewed  CBC WITH DIFFERENTIAL/PLATELET  - Abnormal; Notable for the following components:      Result Value   WBC 3.6 (*)    All other components within normal limits  COMPREHENSIVE METABOLIC PANEL - Abnormal; Notable for the following components:   Glucose, Bld 103 (*)    Total Bilirubin 1.5 (*)  All other components within normal limits  LIPASE, BLOOD  POCT URINALYSIS DIP (DEVICE)  POC URINE PREG, ED    EKG   Radiology No results found.  Procedures Procedures (including critical care time)  Medications Ordered in UC Medications - No data to display  Initial Impression / Assessment and Plan / UC Course  I have reviewed the triage vital signs and the nursing notes.  Pertinent labs & imaging results that were available during my care of the patient were reviewed by me and considered in my medical decision making (see chart for details).     Advised patient to continue pantoprazole, will provide Reglan to use in hopes to help with nausea/gastroparesis.  GI referral placed for follow-up given symptoms x4 months, unable to evaluate for H. pylori today.  We will screen basic labs of CMP, CBC and lipase.  Will call only if abnormal/changing plan.  Discussed strict return precautions. Patient verbalized understanding and is agreeable with plan.  Final Clinical Impressions(s) / UC Diagnoses   Final diagnoses:  Epigastric pain  Gastroparesis     Discharge Instructions     Blood work pending Try reglan before meals and bedtime Continue pantoprazole Follow up with gastroenterology if symptoms persisting   ED Prescriptions    Medication Sig Dispense Auth. Provider   metoCLOPramide (REGLAN) 10 MG tablet Take 1 tablet (10 mg total) by mouth 4 (four) times daily -  before meals and at bedtime. 60 tablet Larue Lightner, [place] C, [name]     PDMP not reviewed this encounter.   Janith Lima, [name] 05/05/20 1434

## 2020-05-06 ENCOUNTER — Telehealth: Payer: Self-pay | Admitting: Gastroenterology

## 2020-05-06 NOTE — Telephone Encounter (Signed)
Pt's husband is calling wanting for pt to be scheduled for an appt. Pt is experiencing abdominal pain and has a referral on file, she is a former Dr Silverio Decamp pt, last seen in 2017. Pt's spouse is wanting for pt to be seen this month if possible but nothing is available.

## 2020-05-06 NOTE — Telephone Encounter (Signed)
Called husband back and scheduled O.V. with Tye Savoy NP 05/07/20. Patient to arrive at 8:15am  Since she has not been here for 4 yrs.

## 2020-05-07 ENCOUNTER — Ambulatory Visit (INDEPENDENT_AMBULATORY_CARE_PROVIDER_SITE_OTHER): Payer: BC Managed Care – PPO | Admitting: Nurse Practitioner

## 2020-05-07 ENCOUNTER — Encounter: Payer: Self-pay | Admitting: Nurse Practitioner

## 2020-05-07 VITALS — BP 141/80 | HR 70 | Ht 67.0 in | Wt 169.0 lb

## 2020-05-07 DIAGNOSIS — K3184 Gastroparesis: Secondary | ICD-10-CM | POA: Diagnosis not present

## 2020-05-07 DIAGNOSIS — R1013 Epigastric pain: Secondary | ICD-10-CM | POA: Diagnosis not present

## 2020-05-07 DIAGNOSIS — R110 Nausea: Secondary | ICD-10-CM

## 2020-05-07 MED ORDER — SUCRALFATE 1 G PO TABS
1.0000 g | ORAL_TABLET | Freq: Three times a day (TID) | ORAL | 1 refills | Status: DC
Start: 2020-05-07 — End: 2020-05-20

## 2020-05-07 MED ORDER — ONDANSETRON 4 MG PO TBDP
4.0000 mg | ORAL_TABLET | Freq: Three times a day (TID) | ORAL | 1 refills | Status: DC | PRN
Start: 2020-05-07 — End: 2023-04-18

## 2020-05-07 NOTE — Patient Instructions (Addendum)
If you are age 45 or older, your body mass index should be between 23-30. Your Body mass index is 26.47 kg/m. If this is out of the aforementioned range listed, please consider follow up with your Primary Care Provider.  If you are age 94 or younger, your body mass index should be between 19-25. Your Body mass index is 26.47 kg/m. If this is out of the aformentioned range listed, please consider follow up with your Primary Care Provider.   We have sent the following medications to your pharmacy for you to pick up at your convenience: Zofran 4 mg ODT every 8 hours as needed for nausea. Carafate 1 gm three times daily before meals.  You have been scheduled for an endoscopy. Please follow written instructions given to you at your visit today. If you use inhalers (even only as needed), please bring them with you on the day of your procedure.   Due to recent changes in healthcare laws, you may see the results of your imaging and laboratory studies on MyChart before your provider has had a chance to review them.  We understand that in some cases there may be results that are confusing or concerning to you. Not all laboratory results come back in the same time frame and the provider may be waiting for multiple results in order to interpret others.  Please give [place] 48 hours in order for your provider to thoroughly review all the results before contacting the office for clarification of your results.

## 2020-05-07 NOTE — Progress Notes (Signed)
IMPRESSION and PLAN:     Tonya Summers is a 44 y.o. female with a PMH signficant for, but not necessarily limited to, GERD, gastroparesis, asthma, cholecystectomy  #Nonradiating epigastric pain and nausea/early satiety --Cause of epigastric pain unclear but she does have mild gastroparesis explaining nausea and early satiety.  She is intolerant of Reglan.  Zofran helped in the past.  Will provide her with Reglan 4 mg to take as needed every 8 hours --Trial of Reglan AC 3 times daily for epigastric burning --For further evaluation of epigastric pain will arrange for EGD. The risks and benefits of EGD were discussed and the patient agrees to proceed.  Patient leaving the country for 2 years on the 19th of this month.  Fortunately Dr. Silverio Decamp has an opening for an EGD before then --Continue Protonix for now  # GERD --Resume Protonix 2 weeks ago with improvement in GERD symptoms --Continue daily PPI  HPI:    Primary GI: Harl Bowie, MD   Chief complaint : Upper abdominal pain  This patient is a 44 year old female who we have seen in the past for history of GERD and gastroparesis.  Patient was in [place] visiting up until 4 months ago.  While in [place] she developed epigastric pain and nausea.  The epigastric pain is described as a burning sensation, nonradiating.  It is mainly in the morning before meals but gets worse after she eats breakfast.  Pain last anywhere from 30 minutes to an hour.  No significant pain throughout the day, patient is able to eat limited quantities because she feels full.  She reports a 4 pound weight loss.  No NSAID use.  Patient was seen in the emergency department on 05/04/2020 for evaluation of symptoms.  She had restarted Protonix 2 months prior for some GERD symptoms and those had already improved on Protonix.  In the ED her labs were basically at baseline.  She was advised to continue Protonix and given a prescription for Reglan for gastroparesis.  ED:    Labs basically at baseline CMP unremarkable except minimally elevated total bilirubin of 1.5.  Lipase normal at 28.  WBC 3.6, hemoglobin 12.5.  Urine pregnancy test negative advised to continue pantoprazole, prescribed Reglan.  INTERVAL HISTORY:  Could not tolerate Reglan as it caused worsening nausea and upper abdominal pain.  She says it did the same thing for years ago when she tried it then and had to call an ambulance.   PREVIOUS ENDOSCOPIC EVALUATIONS / GI studies: 2016 EGD for nausea and vomiting was normal  2016 gastric emptying study showed slight delay at 4 hours measuring 81% retention  2016 abdominal ultrasound-status post cholecystectomy, normal liver normal, bile duct   Review of systems:    All systems reviewed and negative except where noted in the HPI  Past Medical History:  Diagnosis Date  . Asthma   . GERD (gastroesophageal reflux disease)    Family History  Problem Relation Age of Onset  . Colon cancer Neg Hx   . Stomach cancer Neg Hx   . Pancreatic cancer Neg Hx    Social History   Socioeconomic History  . Marital status: Married    Spouse name: Not on file  . Number of children: 1  . Years of education: Not on file  . Highest education level: Not on file  Occupational History  . Not on file  Tobacco Use  . Smoking status: Never Smoker  . Smokeless tobacco: Never Used  Vaping Use  . Vaping Use: Never used  Substance and Sexual Activity  . Alcohol use: No    Alcohol/week: 0.0 standard drinks  . Drug use: No  . Sexual activity: Not Currently    Partners: Male    Birth control/protection: None  Other Topics Concern  . Not on file  Social History Narrative   Marital status: married      Children: 1 child (50)      Moved from [place] in 2016   Social Determinants of Health   Financial Resource Strain:   . Difficulty of Paying Living Expenses:   Food Insecurity:   . Worried About Charity fundraiser in the Last Year:   . Arboriculturist in  the Last Year:   Transportation Needs:   . Film/video editor (Medical):   Marland Kitchen Lack of Transportation (Non-Medical):   Physical Activity:   . Days of Exercise per Week:   . Minutes of Exercise per Session:   Stress:   . Feeling of Stress :   Social Connections:   . Frequency of Communication with Friends and Family:   . Frequency of Social Gatherings with Friends and Family:   . Attends Religious Services:   . Active Member of Clubs or Organizations:   . Attends Archivist Meetings:   Marland Kitchen Marital Status:     Past Surgical History:  Procedure Laterality Date  . BREAST BIOPSY Right 07/2015  . BREAST EXCISIONAL BIOPSY Right 10/2015  . CHOLECYSTECTOMY    . RADIOACTIVE SEED GUIDED EXCISIONAL BREAST BIOPSY Right 11/25/2015   Procedure: RADIOACTIVE SEED GUIDED EXCISIONAL BREAST BIOPSY;  Surgeon: Stark Klein, MD;  Location: [facility];  Service: General;  Laterality: Right;    Serum creatinine: 0.79 mg/dL 05/04/20 1531 Estimated creatinine clearance: 94.8 mL/min  Current Outpatient Medications  Medication Sig Dispense Refill  . pantoprazole (PROTONIX) 40 MG tablet TAKE 1 TABLET(40 MG) BY MOUTH DAILY 30 tablet 0   No current facility-administered medications for this visit.    Filed Weights   05/07/20 0819  Weight: 169 lb (76.7 kg)    Physical Exam:     BP (!) 141/80   Pulse 70   Ht 5\' 7"  (1.702 m)   Wt 169 lb (76.7 kg)   LMP 04/22/2020   BMI 26.47 kg/m   GENERAL:  Pleasant female in NAD PSYCH: : Cooperative, normal affect CARDIAC:  RRR PULM: Normal respiratory effort, lungs CTA bilaterally, no wheezing ABDOMEN:  Nondistended, soft, mild epigastric tenderness.  No obvious masses, no hepatomegaly,  normal bowel sounds SKIN:  turgor, no lesions seen Musculoskeletal:  Normal muscle tone, normal strength NEURO: Alert and oriented x 3, no focal neurologic deficits   Tye Savoy , NP 05/07/2020, 8:39 AM

## 2020-05-07 NOTE — Addendum Note (Signed)
Addended by: Horris Latino on: 05/07/2020 02:16 PM   Modules accepted: Orders

## 2020-05-12 ENCOUNTER — Other Ambulatory Visit: Payer: Self-pay | Admitting: Gastroenterology

## 2020-05-12 ENCOUNTER — Ambulatory Visit (INDEPENDENT_AMBULATORY_CARE_PROVIDER_SITE_OTHER): Payer: BC Managed Care – PPO

## 2020-05-12 DIAGNOSIS — Z1159 Encounter for screening for other viral diseases: Secondary | ICD-10-CM | POA: Diagnosis not present

## 2020-05-13 DIAGNOSIS — N979 Female infertility, unspecified: Secondary | ICD-10-CM | POA: Diagnosis not present

## 2020-05-13 DIAGNOSIS — R232 Flushing: Secondary | ICD-10-CM | POA: Diagnosis not present

## 2020-05-13 LAB — SARS CORONAVIRUS 2 (TAT 6-24 HRS): SARS Coronavirus 2: NEGATIVE

## 2020-05-14 ENCOUNTER — Encounter: Payer: Self-pay | Admitting: Gastroenterology

## 2020-05-14 ENCOUNTER — Ambulatory Visit: Payer: BC Managed Care – PPO | Admitting: Nurse Practitioner

## 2020-05-14 ENCOUNTER — Other Ambulatory Visit: Payer: Self-pay

## 2020-05-14 ENCOUNTER — Ambulatory Visit (AMBULATORY_SURGERY_CENTER): Payer: BC Managed Care – PPO | Admitting: Gastroenterology

## 2020-05-14 VITALS — BP 126/76 | HR 67 | Temp 96.8°F | Resp 16 | Ht 67.0 in | Wt 169.0 lb

## 2020-05-14 DIAGNOSIS — K3189 Other diseases of stomach and duodenum: Secondary | ICD-10-CM | POA: Diagnosis not present

## 2020-05-14 DIAGNOSIS — K319 Disease of stomach and duodenum, unspecified: Secondary | ICD-10-CM | POA: Diagnosis not present

## 2020-05-14 DIAGNOSIS — K2970 Gastritis, unspecified, without bleeding: Secondary | ICD-10-CM | POA: Diagnosis not present

## 2020-05-14 DIAGNOSIS — R1013 Epigastric pain: Secondary | ICD-10-CM

## 2020-05-14 DIAGNOSIS — R109 Unspecified abdominal pain: Secondary | ICD-10-CM | POA: Diagnosis not present

## 2020-05-14 MED ORDER — SODIUM CHLORIDE 0.9 % IV SOLN: 500.0000 mL | Freq: Once | INTRAVENOUS | Status: DC

## 2020-05-14 NOTE — Progress Notes (Signed)
Pt. Requested that she be called with the results of her biopsies, as she is leaving the country in 10 days and will not be back for 10 years.  Translator from [facility] present with patient.

## 2020-05-14 NOTE — Op Note (Signed)
[facility] Patient Name: Tonya Summers Procedure Date: 05/14/2020 2:03 PM MRN: [id number] Endoscopist: Mauri Pole , MD Age: 44 Referring MD:  Date of Birth: [birth year] Gender: Female Account #: [id number] Procedure:                Upper GI endoscopy Indications:              Epigastric abdominal pain Medicines:                Monitored Anesthesia Care Procedure:                Pre-Anesthesia Assessment:                           - Prior to the procedure, a History and Physical                            was performed, and patient medications and                            allergies were reviewed. The patient's tolerance of                            previous anesthesia was also reviewed. The risks                            and benefits of the procedure and the sedation                            options and risks were discussed with the patient.                            All questions were answered, and informed consent                            was obtained. Prior Anticoagulants: The patient has                            taken no previous anticoagulant or antiplatelet                            agents. ASA Grade Assessment: II - A patient with                            mild systemic disease. After reviewing the risks                            and benefits, the patient was deemed in                            satisfactory condition to undergo the procedure.                           After obtaining informed consent, the endoscope was  passed under direct vision. Throughout the                            procedure, the patient's blood pressure, pulse, and                            oxygen saturations were monitored continuously. The                            Endoscope was introduced through the mouth, and                            advanced to the second part of duodenum. The upper                            GI endoscopy was  accomplished without difficulty.                            Photos taken from stomach were not saved due to                            technical error. The patient tolerated the                            procedure well. Scope In: Scope Out: Findings:                 The esophagus was normal.                           The Z-line was regular and was found 35 cm from the                            incisors.                           Patchy mild inflammation characterized by                            congestion (edema) and erythema was found in the                            entire examined stomach. Biopsies were taken with a                            cold forceps for Helicobacter pylori testing.                           The examined duodenum was normal. Complications:            No immediate complications. Estimated Blood Loss:     Estimated blood loss was minimal. Impression:               - Normal esophagus.                           -  Z-line regular, 35 cm from the incisors.                           - Gastritis. Biopsied.                           - Normal examined duodenum. Recommendation:           - Patient has a contact number available for                            emergencies. The signs and symptoms of potential                            delayed complications were discussed with the                            patient. Return to normal activities tomorrow.                            Written discharge instructions were provided to the                            patient.                           - Resume previous diet.                           - Continue present medications.                           - Await pathology results. Mauri Pole, MD 05/14/2020 2:32:27 PM This report has been signed electronically.

## 2020-05-14 NOTE — Progress Notes (Signed)
Pt's states no medical or surgical changes since previsit or office visit.  Interpreter used: Joycelyn Man Ohag  VS CW

## 2020-05-14 NOTE — Progress Notes (Signed)
Called to room to assist during endoscopic procedure.  Patient ID and intended procedure confirmed with present staff. Received instructions for my participation in the procedure from the performing physician.  

## 2020-05-14 NOTE — Progress Notes (Signed)
PT taken to PACU. Monitors in place. VSS. Report given to RN. 

## 2020-05-14 NOTE — Patient Instructions (Addendum)
Impression/Recommendations:  Gastritis  Handout given to patient.  Use over the counter antihistamine such as Claritin to medication regimen.  Resume previous diet. Continue present medications. Await pathology results.  YOU HAD AN ENDOSCOPIC PROCEDURE TODAY AT [facility]:   Refer to the procedure report that was given to you for any specific questions about what was found during the examination.  If the procedure report does not answer your questions, please call your gastroenterologist to clarify.  If you requested that your care partner not be given the details of your procedure findings, then the procedure report has been included in a sealed envelope for you to review at your convenience later.  YOU SHOULD EXPECT: Some feelings of bloating in the abdomen. Passage of more gas than usual.  Walking can help get rid of the air that was put into your GI tract during the procedure and reduce the bloating. If you had a lower endoscopy (such as a colonoscopy or flexible sigmoidoscopy) you may notice spotting of blood in your stool or on the toilet paper. If you underwent a bowel prep for your procedure, you may not have a normal bowel movement for a few days.  Please Note:  You might notice some irritation and congestion in your nose or some drainage.  This is from the oxygen used during your procedure.  There is no need for concern and it should clear up in a day or so.  SYMPTOMS TO REPORT IMMEDIATELY:  Following upper endoscopy (EGD)  Vomiting of blood or coffee ground material  New chest pain or pain under the shoulder blades  Painful or persistently difficult swallowing  New shortness of breath  Fever of 100F or higher  Black, tarry-looking stools  For urgent or emergent issues, a gastroenterologist can be reached at any hour by calling [phone]. Do not use MyChart messaging for urgent concerns.    DIET:  We do recommend a small meal at first, but then you may  proceed to your regular diet.  Drink plenty of fluids but you should avoid alcoholic beverages for 24 hours.  ACTIVITY:  You should plan to take it easy for the rest of today and you should NOT DRIVE or use heavy machinery until tomorrow (because of the sedation medicines used during the test).    FOLLOW UP: Our staff will call the number listed on your records 48-72 hours following your procedure to check on you and address any questions or concerns that you may have regarding the information given to you following your procedure. If we do not reach you, we will leave a message.  We will attempt to reach you two times.  During this call, we will ask if you have developed any symptoms of COVID 19. If you develop any symptoms (ie: fever, flu-like symptoms, shortness of breath, cough etc.) before then, please call [phone].  If you test positive for Covid 19 in the 2 weeks post procedure, please call and report this information to [place].    If any biopsies were taken you will be contacted by phone or by letter within the next 1-3 weeks.  Please call us at [phone] if you have not heard about the biopsies in 3 weeks.    SIGNATURES/CONFIDENTIALITY: You and/or your care partner have signed paperwork which will be entered into your electronic medical record.  These signatures attest to the fact that that the information above on your After Visit Summary has been reviewed and is understood.  Full responsibility of the confidentiality of this discharge information lies with you and/or your care-partner.

## 2020-05-18 ENCOUNTER — Telehealth: Payer: Self-pay | Admitting: *Deleted

## 2020-05-18 ENCOUNTER — Telehealth: Payer: Self-pay

## 2020-05-18 NOTE — Telephone Encounter (Signed)
Called [phone] and left a message we tried to reach pt for a follow up call. maw

## 2020-05-18 NOTE — Telephone Encounter (Signed)
  Follow up Call-  Call back number 05/14/2020  Post procedure Call Back phone  # [phone]  Permission to leave phone message Yes  Some recent data might be hidden     Patient questions:  Do you have a fever, pain , or abdominal swelling? No. Pain Score  0 *  Have you tolerated food without any problems? Yes.    Have you been able to return to your normal activities? Yes.    Do you have any questions about your discharge instructions: Diet   No. Medications  No. Follow up visit  No.  Do you have questions or concerns about your Care? No.  Actions: * If pain score is 4 or above: 1. No action needed, pain <4.Have you developed a fever since your procedure? no  2.   Have you had an respiratory symptoms (SOB or cough) since your procedure? no  3.   Have you tested positive for COVID 19 since your procedure no  4.   Have you had any family members/close contacts diagnosed with the COVID 19 since your procedure?  no   If yes to any of these questions please route to Joylene John, RN and Erenest Rasher, RN

## 2020-05-18 NOTE — Telephone Encounter (Signed)
1. Have you developed a fever since your procedure? no  2.   Have you had an respiratory symptoms (SOB or cough) since your procedure? no  3.   Have you tested positive for COVID 19 since your procedure no  4.   Have you had any family members/close contacts diagnosed with the COVID 19 since your procedure?  no   If yes to any of these questions please route to Joylene John, RN and Erenest Rasher, RN Follow up Call-  Call back number 05/14/2020  Post procedure Call Back phone  # [phone]  Permission to leave phone message Yes  Some recent data might be hidden     Patient questions:  Do you have a fever, pain , or abdominal swelling? No. Pain Score  0 *  Have you tolerated food without any problems? Yes.    Have you been able to return to your normal activities? Yes.    Do you have any questions about your discharge instructions: Diet   No. Medications  No. Follow up visit  No.  Do you have questions or concerns about your Care? No.  Actions: * If pain score is 4 or above: No action needed, pain <4.

## 2020-05-20 ENCOUNTER — Telehealth: Payer: Self-pay | Admitting: Gastroenterology

## 2020-05-20 MED ORDER — PANTOPRAZOLE SODIUM 40 MG PO TBEC: DELAYED_RELEASE_TABLET | ORAL | 6 refills | Status: DC

## 2020-05-20 MED ORDER — SUCRALFATE 1 G PO TABS: 1.0000 g | ORAL_TABLET | Freq: Three times a day (TID) | ORAL | 1 refills | Status: DC

## 2020-05-20 NOTE — Telephone Encounter (Signed)
Pt's spouse Is requesting medication refills for the pt's pantoprazole and carafate

## 2020-05-20 NOTE — Telephone Encounter (Signed)
Sent refills of protonix and carafate to pts pharmacy

## 2020-05-21 ENCOUNTER — Telehealth: Payer: Self-pay

## 2020-05-21 ENCOUNTER — Encounter: Payer: Self-pay | Admitting: Gastroenterology

## 2020-05-21 ENCOUNTER — Telehealth: Payer: Self-pay | Admitting: Gastroenterology

## 2020-05-21 NOTE — Telephone Encounter (Signed)
Called pt. To report that her biopsies from her endoscopic procedure on 05/14/2020 were all negative.

## 2020-05-24 DIAGNOSIS — R1084 Generalized abdominal pain: Secondary | ICD-10-CM | POA: Diagnosis not present

## 2020-05-24 DIAGNOSIS — R1013 Epigastric pain: Secondary | ICD-10-CM | POA: Diagnosis not present

## 2020-05-24 DIAGNOSIS — N915 Oligomenorrhea, unspecified: Secondary | ICD-10-CM | POA: Diagnosis not present

## 2020-05-26 DIAGNOSIS — N979 Female infertility, unspecified: Secondary | ICD-10-CM | POA: Diagnosis not present

## 2020-05-26 DIAGNOSIS — E8941 Symptomatic postprocedural ovarian failure: Secondary | ICD-10-CM | POA: Diagnosis not present

## 2020-05-28 DIAGNOSIS — Z20822 Contact with and (suspected) exposure to covid-19: Secondary | ICD-10-CM | POA: Diagnosis not present

## 2020-06-01 NOTE — Progress Notes (Signed)
Reviewed and agree with documentation and assessment and plan. K. Veena Maya Scholer , MD   

## 2020-07-09 ENCOUNTER — Ambulatory Visit: Payer: BC Managed Care – PPO | Admitting: Nurse Practitioner

## 2021-06-26 ENCOUNTER — Other Ambulatory Visit: Payer: Self-pay | Admitting: Gastroenterology

## 2022-01-25 DIAGNOSIS — K219 Gastro-esophageal reflux disease without esophagitis: Secondary | ICD-10-CM | POA: Diagnosis not present

## 2022-02-07 DIAGNOSIS — R112 Nausea with vomiting, unspecified: Secondary | ICD-10-CM | POA: Diagnosis not present

## 2022-02-07 DIAGNOSIS — Z6823 Body mass index (BMI) 23.0-23.9, adult: Secondary | ICD-10-CM | POA: Diagnosis not present

## 2022-02-07 DIAGNOSIS — R197 Diarrhea, unspecified: Secondary | ICD-10-CM | POA: Diagnosis not present

## 2022-02-07 DIAGNOSIS — K3184 Gastroparesis: Secondary | ICD-10-CM | POA: Diagnosis not present

## 2022-02-09 DIAGNOSIS — K3184 Gastroparesis: Secondary | ICD-10-CM | POA: Diagnosis not present

## 2022-02-09 DIAGNOSIS — R1013 Epigastric pain: Secondary | ICD-10-CM | POA: Diagnosis not present

## 2022-02-24 DIAGNOSIS — R198 Other specified symptoms and signs involving the digestive system and abdomen: Secondary | ICD-10-CM | POA: Diagnosis not present

## 2022-02-24 DIAGNOSIS — R1915 Other abnormal bowel sounds: Secondary | ICD-10-CM | POA: Diagnosis not present

## 2022-02-24 DIAGNOSIS — R634 Abnormal weight loss: Secondary | ICD-10-CM | POA: Diagnosis not present

## 2022-02-24 DIAGNOSIS — R110 Nausea: Secondary | ICD-10-CM | POA: Diagnosis not present

## 2022-02-27 ENCOUNTER — Other Ambulatory Visit (HOSPITAL_BASED_OUTPATIENT_CLINIC_OR_DEPARTMENT_OTHER): Payer: Self-pay | Admitting: Family Medicine

## 2022-02-27 DIAGNOSIS — R112 Nausea with vomiting, unspecified: Secondary | ICD-10-CM

## 2022-02-27 DIAGNOSIS — R1915 Other abnormal bowel sounds: Secondary | ICD-10-CM

## 2022-02-28 ENCOUNTER — Ambulatory Visit (HOSPITAL_BASED_OUTPATIENT_CLINIC_OR_DEPARTMENT_OTHER)
Admission: RE | Admit: 2022-02-28 | Discharge: 2022-02-28 | Disposition: A | Discharge status: Home / Self Care | Payer: BC Managed Care – PPO | Source: Ambulatory Visit | Attending: Family Medicine | Admitting: Family Medicine

## 2022-02-28 DIAGNOSIS — R112 Nausea with vomiting, unspecified: Secondary | ICD-10-CM

## 2022-02-28 DIAGNOSIS — R1915 Other abnormal bowel sounds: Secondary | ICD-10-CM | POA: Diagnosis not present

## 2022-02-28 DIAGNOSIS — Z9049 Acquired absence of other specified parts of digestive tract: Secondary | ICD-10-CM | POA: Diagnosis not present

## 2022-03-06 ENCOUNTER — Other Ambulatory Visit: Payer: Self-pay | Admitting: Family Medicine

## 2022-03-06 DIAGNOSIS — R110 Nausea: Secondary | ICD-10-CM

## 2022-03-06 DIAGNOSIS — R634 Abnormal weight loss: Secondary | ICD-10-CM

## 2022-03-21 ENCOUNTER — Ambulatory Visit
Admission: RE | Admit: 2022-03-21 | Discharge: 2022-03-21 | Disposition: A | Discharge status: Home / Self Care | Payer: BC Managed Care – PPO | Source: Ambulatory Visit | Attending: Family Medicine | Admitting: Family Medicine

## 2022-03-21 DIAGNOSIS — Z9049 Acquired absence of other specified parts of digestive tract: Secondary | ICD-10-CM | POA: Diagnosis not present

## 2022-03-21 DIAGNOSIS — R634 Abnormal weight loss: Secondary | ICD-10-CM

## 2022-03-21 DIAGNOSIS — R110 Nausea: Secondary | ICD-10-CM

## 2022-03-21 DIAGNOSIS — N281 Cyst of kidney, acquired: Secondary | ICD-10-CM | POA: Diagnosis not present

## 2022-03-21 DIAGNOSIS — K760 Fatty (change of) liver, not elsewhere classified: Secondary | ICD-10-CM | POA: Diagnosis not present

## 2022-03-21 MED ORDER — GADOBENATE DIMEGLUMINE 529 MG/ML IV SOLN
15.0000 mL | Freq: Once | INTRAVENOUS | Status: AC | PRN
  Administered 2022-03-21: 15 mL via INTRAVENOUS

## 2022-04-06 DIAGNOSIS — F419 Anxiety disorder, unspecified: Secondary | ICD-10-CM | POA: Diagnosis not present

## 2022-04-06 DIAGNOSIS — R634 Abnormal weight loss: Secondary | ICD-10-CM | POA: Diagnosis not present

## 2022-04-06 DIAGNOSIS — K3184 Gastroparesis: Secondary | ICD-10-CM | POA: Diagnosis not present

## 2022-04-06 DIAGNOSIS — R5383 Other fatigue: Secondary | ICD-10-CM | POA: Diagnosis not present

## 2022-04-10 DIAGNOSIS — K3184 Gastroparesis: Secondary | ICD-10-CM | POA: Diagnosis not present

## 2022-04-10 DIAGNOSIS — K760 Fatty (change of) liver, not elsewhere classified: Secondary | ICD-10-CM | POA: Diagnosis not present

## 2022-04-10 DIAGNOSIS — R5383 Other fatigue: Secondary | ICD-10-CM | POA: Diagnosis not present

## 2022-04-10 DIAGNOSIS — R634 Abnormal weight loss: Secondary | ICD-10-CM | POA: Diagnosis not present

## 2022-07-24 DIAGNOSIS — K3184 Gastroparesis: Secondary | ICD-10-CM | POA: Diagnosis not present

## 2022-07-24 DIAGNOSIS — R112 Nausea with vomiting, unspecified: Secondary | ICD-10-CM | POA: Diagnosis not present

## 2022-07-24 DIAGNOSIS — R634 Abnormal weight loss: Secondary | ICD-10-CM | POA: Diagnosis not present

## 2022-07-24 DIAGNOSIS — R1013 Epigastric pain: Secondary | ICD-10-CM | POA: Diagnosis not present

## 2022-07-25 DIAGNOSIS — K3189 Other diseases of stomach and duodenum: Secondary | ICD-10-CM | POA: Diagnosis not present

## 2022-07-25 DIAGNOSIS — R1013 Epigastric pain: Secondary | ICD-10-CM | POA: Diagnosis not present

## 2022-07-25 DIAGNOSIS — K319 Disease of stomach and duodenum, unspecified: Secondary | ICD-10-CM | POA: Diagnosis not present

## 2022-07-25 DIAGNOSIS — R634 Abnormal weight loss: Secondary | ICD-10-CM | POA: Diagnosis not present

## 2022-07-25 DIAGNOSIS — R112 Nausea with vomiting, unspecified: Secondary | ICD-10-CM | POA: Diagnosis not present

## 2022-07-28 DIAGNOSIS — R109 Unspecified abdominal pain: Secondary | ICD-10-CM | POA: Diagnosis not present

## 2022-07-28 DIAGNOSIS — R1013 Epigastric pain: Secondary | ICD-10-CM | POA: Diagnosis not present

## 2022-07-28 DIAGNOSIS — R188 Other ascites: Secondary | ICD-10-CM | POA: Diagnosis not present

## 2022-07-28 DIAGNOSIS — R112 Nausea with vomiting, unspecified: Secondary | ICD-10-CM | POA: Diagnosis not present

## 2022-07-28 DIAGNOSIS — R634 Abnormal weight loss: Secondary | ICD-10-CM | POA: Diagnosis not present

## 2022-07-28 DIAGNOSIS — R1110 Vomiting, unspecified: Secondary | ICD-10-CM | POA: Diagnosis not present

## 2022-09-27 DIAGNOSIS — R1012 Left upper quadrant pain: Secondary | ICD-10-CM | POA: Diagnosis not present

## 2022-09-27 DIAGNOSIS — R1013 Epigastric pain: Secondary | ICD-10-CM | POA: Diagnosis not present

## 2022-10-12 ENCOUNTER — Other Ambulatory Visit: Payer: Self-pay

## 2022-10-12 MED ORDER — PANTOPRAZOLE SODIUM 40 MG PO TBEC: DELAYED_RELEASE_TABLET | ORAL | 6 refills | Status: DC

## 2023-01-12 DIAGNOSIS — R634 Abnormal weight loss: Secondary | ICD-10-CM | POA: Diagnosis not present

## 2023-01-12 DIAGNOSIS — R195 Other fecal abnormalities: Secondary | ICD-10-CM | POA: Diagnosis not present

## 2023-01-12 DIAGNOSIS — R1011 Right upper quadrant pain: Secondary | ICD-10-CM | POA: Diagnosis not present

## 2023-01-12 DIAGNOSIS — Z1211 Encounter for screening for malignant neoplasm of colon: Secondary | ICD-10-CM | POA: Diagnosis not present

## 2023-01-23 DIAGNOSIS — R1011 Right upper quadrant pain: Secondary | ICD-10-CM | POA: Diagnosis not present

## 2023-01-23 DIAGNOSIS — G8929 Other chronic pain: Secondary | ICD-10-CM | POA: Diagnosis not present

## 2023-01-23 DIAGNOSIS — R109 Unspecified abdominal pain: Secondary | ICD-10-CM | POA: Diagnosis not present

## 2023-01-23 DIAGNOSIS — Z9049 Acquired absence of other specified parts of digestive tract: Secondary | ICD-10-CM | POA: Diagnosis not present

## 2023-01-31 DIAGNOSIS — Z1211 Encounter for screening for malignant neoplasm of colon: Secondary | ICD-10-CM | POA: Diagnosis not present

## 2023-01-31 DIAGNOSIS — K648 Other hemorrhoids: Secondary | ICD-10-CM | POA: Diagnosis not present

## 2023-03-23 DIAGNOSIS — R140 Abdominal distension (gaseous): Secondary | ICD-10-CM | POA: Diagnosis not present

## 2023-03-23 DIAGNOSIS — K219 Gastro-esophageal reflux disease without esophagitis: Secondary | ICD-10-CM | POA: Diagnosis not present

## 2023-03-23 DIAGNOSIS — R195 Other fecal abnormalities: Secondary | ICD-10-CM | POA: Diagnosis not present

## 2023-03-23 DIAGNOSIS — Z9049 Acquired absence of other specified parts of digestive tract: Secondary | ICD-10-CM | POA: Diagnosis not present

## 2023-03-28 DIAGNOSIS — Z79899 Other long term (current) drug therapy: Secondary | ICD-10-CM | POA: Diagnosis not present

## 2023-03-28 DIAGNOSIS — R142 Eructation: Secondary | ICD-10-CM | POA: Diagnosis not present

## 2023-03-28 DIAGNOSIS — R140 Abdominal distension (gaseous): Secondary | ICD-10-CM | POA: Diagnosis not present

## 2023-03-28 DIAGNOSIS — K638219 Small intestinal bacterial overgrowth, unspecified: Secondary | ICD-10-CM | POA: Diagnosis not present

## 2023-03-28 DIAGNOSIS — R634 Abnormal weight loss: Secondary | ICD-10-CM | POA: Diagnosis not present

## 2023-03-28 DIAGNOSIS — R1084 Generalized abdominal pain: Secondary | ICD-10-CM | POA: Diagnosis not present

## 2023-04-03 DIAGNOSIS — R195 Other fecal abnormalities: Secondary | ICD-10-CM | POA: Diagnosis not present

## 2023-04-18 ENCOUNTER — Encounter: Payer: Self-pay | Admitting: Internal Medicine

## 2023-04-18 ENCOUNTER — Ambulatory Visit: Payer: BC Managed Care – PPO | Admitting: Internal Medicine

## 2023-04-18 VITALS — BP 118/78 | HR 78 | Temp 98.3°F | Resp 18 | Ht 67.0 in | Wt 103.4 lb

## 2023-04-18 DIAGNOSIS — K219 Gastro-esophageal reflux disease without esophagitis: Secondary | ICD-10-CM | POA: Diagnosis not present

## 2023-04-18 DIAGNOSIS — K3184 Gastroparesis: Secondary | ICD-10-CM | POA: Diagnosis not present

## 2023-04-18 DIAGNOSIS — R634 Abnormal weight loss: Secondary | ICD-10-CM | POA: Diagnosis not present

## 2023-04-18 DIAGNOSIS — R6881 Early satiety: Secondary | ICD-10-CM | POA: Diagnosis not present

## 2023-04-18 MED ORDER — MIRTAZAPINE 15 MG PO TABS: 15.0000 mg | ORAL_TABLET | Freq: Every day | ORAL | 2 refills | Status: DC

## 2023-04-18 NOTE — Assessment & Plan Note (Signed)
She has extensive investigation and no cause was found, she was referred to see nutritionist and they are waiting for the call.

## 2023-04-18 NOTE — Assessment & Plan Note (Signed)
She has next follow up appointment with GI in 08/2023.

## 2023-04-18 NOTE — Progress Notes (Signed)
   Office Visit  Subjective   Patient ID: Tonya Summers   DOB: 1976/06/30   Age: 47 y.o.   MRN: [id number]   Chief Complaint Chief Complaint  Patient presents with   Follow-up    Follow up     History of Present Illness 47 years old female who is here for follow-up.  She said that she is still feeling nauseous and has lost more weight.  I have reviewed her [facility] gastro[facility]ology workup record.  She has a diagnosis of gastroparesis but per her husband when they have repeated the study they were told that everything is fine.  She has a stool study that was negative for bacterial overgrowth. She has low WBC count and she was advised to have follow-up with hematology and next appointment is November.  She also has a follow-up appointment with gastroenterology again in November.  She has lost 5 more pounds since last visit with [facility] month ago.  Husband says that he cannot wait that long as she is keep losing weight. She denies having any fever or chills.  She is trying to eat small frequent meals but she is not tolerating that either.  Past Medical History Past Medical History:  Diagnosis Date   Asthma    GERD (gastroesophageal reflux disease)      Allergies Allergies  Allergen Reactions   No Known Allergies      Review of Systems Review of Systems  Constitutional:  Positive for malaise/fatigue and weight loss.  Respiratory: Negative.    Cardiovascular: Negative.   Gastrointestinal:  Positive for nausea.       Objective:    Vitals BP 118/78 (BP Location: Left Arm, Patient Position: Sitting, Cuff Size: Normal)   Pulse 78   Temp 98.3 F (36.8 C)   Resp 18   Ht 5\' 7"  (1.702 m)   Wt 103 lb 6 oz (46.9 kg)   SpO2 99%   BMI 16.19 kg/m    Physical Examination Physical Exam Constitutional:      Appearance: She is ill-appearing.  HENT:     Head: Normocephalic and atraumatic.  Cardiovascular:     Rate and Rhythm: Normal rate and regular rhythm.     Heart sounds:  Normal heart sounds.  Pulmonary:     Effort: Pulmonary effort is normal.     Breath sounds: Normal breath sounds.  Abdominal:     General: Bowel sounds are normal.     Palpations: Abdomen is soft.  Neurological:     General: No focal deficit present.     Mental Status: She is oriented to person, place, and time.        Assessment & Plan:   Weight loss She has extensive investigation and no cause was found, she was referred to see nutritionist and they are waiting for the call.   Early satiety She has next follow up appointment with GI in 08/2023.  GERD (gastroesophageal reflux disease) Continue with omeprazole.  Gastroparesis She will follow-up with gastroenterology.  I will try to find repeat study of stomach emptying.   Patient did mention that her stools float in in the commode and I have suggested that I will give her a sample of Zenpep to see if that helps. Return in about 1 month (around 05/18/2023).   Eloisa Northern, MD

## 2023-04-20 ENCOUNTER — Ambulatory Visit: Payer: BC Managed Care – PPO | Admitting: Internal Medicine

## 2023-04-20 DIAGNOSIS — R634 Abnormal weight loss: Secondary | ICD-10-CM

## 2023-04-20 NOTE — Progress Notes (Signed)
Nurse visit blood draw.

## 2023-04-21 LAB — CBC WITH DIFFERENTIAL/PLATELET
Basophils Absolute: 0 10*3/uL (ref 0.0–0.2)
Basos: 2 %
EOS (ABSOLUTE): 0 10*3/uL (ref 0.0–0.4)
Eos: 1 %
Hematocrit: 38.3 % (ref 34.0–46.6)
Hemoglobin: 12.9 g/dL (ref 11.1–15.9)
Immature Grans (Abs): 0 10*3/uL (ref 0.0–0.1)
Immature Granulocytes: 0 %
Lymphocytes Absolute: 1.5 10*3/uL (ref 0.7–3.1)
Lymphs: 60 %
MCH: 29.9 pg (ref 26.6–33.0)
MCHC: 33.7 g/dL (ref 31.5–35.7)
MCV: 89 fL (ref 79–97)
Monocytes Absolute: 0.2 10*3/uL (ref 0.1–0.9)
Monocytes: 7 %
Neutrophils Absolute: 0.7 10*3/uL — ABNORMAL LOW (ref 1.4–7.0)
Neutrophils: 30 %
Platelets: 213 10*3/uL (ref 150–450)
RBC: 4.32 x10E6/uL (ref 3.77–5.28)
RDW: 12.9 % (ref 11.7–15.4)
WBC: 2.4 10*3/uL — CL (ref 3.4–10.8)

## 2023-04-21 LAB — CMP14 + ANION GAP
ALT: 17 IU/L (ref 0–32)
AST: 20 IU/L (ref 0–40)
Albumin: 4.5 g/dL (ref 3.9–4.9)
Alkaline Phosphatase: 48 IU/L (ref 44–121)
Anion Gap: 13 mmol/L (ref 10.0–18.0)
BUN/Creatinine Ratio: 19 (ref 9–23)
BUN: 16 mg/dL (ref 6–24)
Bilirubin Total: 1.3 mg/dL — ABNORMAL HIGH (ref 0.0–1.2)
CO2: 26 mmol/L (ref 20–29)
Calcium: 9.9 mg/dL (ref 8.7–10.2)
Chloride: 103 mmol/L (ref 96–106)
Creatinine, Ser: 0.85 mg/dL (ref 0.57–1.00)
Globulin, Total: 2.6 g/dL (ref 1.5–4.5)
Glucose: 92 mg/dL (ref 70–99)
Potassium: 4.3 mmol/L (ref 3.5–5.2)
Sodium: 142 mmol/L (ref 134–144)
Total Protein: 7.1 g/dL (ref 6.0–8.5)
eGFR: 84 mL/min/{1.73_m2} (ref >59)

## 2023-04-21 LAB — HEMOGLOBIN A1C
Est. average glucose Bld gHb Est-mCnc: 108 mg/dL
Hgb A1c MFr Bld: 5.4 % (ref 4.8–5.6)

## 2023-04-24 NOTE — Assessment & Plan Note (Signed)
She will follow-up with gastroenterology.  I will try to find repeat study of stomach emptying.

## 2023-04-24 NOTE — Assessment & Plan Note (Signed)
Continue with omeprazole 

## 2023-05-23 ENCOUNTER — Ambulatory Visit: Payer: BC Managed Care – PPO | Admitting: Internal Medicine

## 2023-05-23 ENCOUNTER — Encounter: Payer: Self-pay | Admitting: Internal Medicine

## 2023-05-23 VITALS — BP 122/78 | HR 71 | Temp 97.8°F | Resp 18 | Ht 67.0 in | Wt 104.0 lb

## 2023-05-23 DIAGNOSIS — D709 Neutropenia, unspecified: Secondary | ICD-10-CM | POA: Diagnosis not present

## 2023-05-23 DIAGNOSIS — K8689 Other specified diseases of pancreas: Secondary | ICD-10-CM | POA: Insufficient documentation

## 2023-05-23 DIAGNOSIS — R634 Abnormal weight loss: Secondary | ICD-10-CM

## 2023-05-23 DIAGNOSIS — F3341 Major depressive disorder, recurrent, in partial remission: Secondary | ICD-10-CM | POA: Diagnosis not present

## 2023-05-23 MED ORDER — ZENPEP 25000-79000 UNITS PO CPEP: ORAL_CAPSULE | ORAL | 4 refills | Status: DC

## 2023-05-23 NOTE — Assessment & Plan Note (Signed)
Better  

## 2023-05-23 NOTE — Assessment & Plan Note (Signed)
She has gained 1 pounds since last visit.

## 2023-05-23 NOTE — Assessment & Plan Note (Signed)
She will take 2 Zenpep capsule with each meal and 1 with each snacks.

## 2023-05-23 NOTE — Progress Notes (Addendum)
   Office Visit  Subjective   Patient ID: Tonya Summers   DOB: 1975-12-18   Age: 47 y.o.   MRN: [id number]   Chief Complaint Chief Complaint  Patient presents with   Follow-up    1 month follow up     History of Present Illness 47 years old female is here for follow up. She says that zenpep has helped her a lot, her stool does not float in toilet. Her depression is also better. She is sleeping ok.   Past Medical History Past Medical History:  Diagnosis Date   Asthma    GERD (gastroesophageal reflux disease)      Allergies Allergies  Allergen Reactions   No Known Allergies      Review of Systems ROS     Objective:    Vitals BP 122/78 (BP Location: Left Arm, Patient Position: Sitting, Cuff Size: Normal)   Pulse 71   Temp 97.8 F (36.6 C)   Resp 18   Ht 5\' 7"  (1.702 m)   Wt 104 lb (47.2 kg)   SpO2 99%   BMI 16.29 kg/m    Physical Examination Physical Exam     Assessment & Plan:   No problem-specific Assessment & Plan notes found for this encounter.    No follow-ups on file.   Eloisa Northern, MD

## 2023-05-24 NOTE — Assessment & Plan Note (Signed)
I will repeat labs on next visit and also will call her hematologist to see if she need early appointment.

## 2023-06-08 ENCOUNTER — Other Ambulatory Visit: Payer: Self-pay

## 2023-06-08 MED ORDER — PANTOPRAZOLE SODIUM 40 MG PO TBEC: 40.0000 mg | DELAYED_RELEASE_TABLET | Freq: Two times a day (BID) | ORAL | 1 refills | Status: DC

## 2023-06-22 ENCOUNTER — Ambulatory Visit: Payer: BC Managed Care – PPO | Admitting: Internal Medicine

## 2023-06-22 ENCOUNTER — Encounter: Payer: Self-pay | Admitting: Internal Medicine

## 2023-06-22 VITALS — BP 110/70 | HR 78 | Temp 98.7°F | Resp 18 | Ht 67.0 in | Wt 107.1 lb

## 2023-06-22 DIAGNOSIS — R627 Adult failure to thrive: Secondary | ICD-10-CM

## 2023-06-22 DIAGNOSIS — D709 Neutropenia, unspecified: Secondary | ICD-10-CM | POA: Diagnosis not present

## 2023-06-22 DIAGNOSIS — K8689 Other specified diseases of pancreas: Secondary | ICD-10-CM | POA: Diagnosis not present

## 2023-06-22 DIAGNOSIS — K219 Gastro-esophageal reflux disease without esophagitis: Secondary | ICD-10-CM

## 2023-06-22 NOTE — Assessment & Plan Note (Signed)
I will repeat CBC today.

## 2023-06-22 NOTE — Progress Notes (Signed)
   Office Visit  Subjective   Patient ID: Tonya Summers   DOB: 09/20/1976   Age: 47 y.o.   MRN: [id number]   Chief Complaint Chief Complaint  Patient presents with   Follow-up    Follow up     History of Present Illness 47 years old female is here for follow up. She says that zenpep has pork in it so she stop using that. She says since she stop that capsule, she has diarrhea and fstool float in toilet. She is asking if there is any thing else.     She says her appetite is also better, she eats small meals and takes snack. She has gained 3 pounds since last visit.   I have reviewed her labs with her, her all labs were ok except WBC was 2.4 on 04/20/23. She has appointment to see hematologist in November 24. I have spoken with her about conversation with hematologist at [facility], as her national status improve, hopefully her WBC will also improve. She is worried about neutropenia associated with Remeron. She has neutropenia before she was started her on Remeron.    She looks better today and her husband tells me that she started doing better with new medications. Her depression is better with remeron and she also is sleeping better.   She has GERD and is asking for refill of her PPI, she don't use it every day.   Past Medical History Past Medical History:  Diagnosis Date   Asthma    GERD (gastroesophageal reflux disease)      Allergies Allergies  Allergen Reactions   No Known Allergies      Review of Systems Review of Systems  Constitutional: Negative.   Respiratory: Negative.    Cardiovascular: Negative.   Gastrointestinal:  Positive for diarrhea.       Objective:    Vitals BP 110/70 (BP Location: Left Arm, Patient Position: Sitting, Cuff Size: Normal)   Pulse 78   Temp 98.7 F (37.1 C)   Resp 18   Ht 5\' 7"  (1.702 m)   Wt 107 lb 2 oz (48.6 kg)   SpO2 99%   BMI 16.78 kg/m    Physical Examination Physical Exam Constitutional:      Appearance: Normal  appearance.  HENT:     Head: Normocephalic and atraumatic.  Cardiovascular:     Rate and Rhythm: Regular rhythm.     Heart sounds: Normal heart sounds.  Pulmonary:     Effort: Pulmonary effort is normal.     Breath sounds: Normal breath sounds.  Neurological:     General: No focal deficit present.     Mental Status: She is alert and oriented to person, place, and time.        Assessment & Plan:   Adult failure to thrive She has gained 7 pounds since last visit. I will do TSH  Pancreatic insufficiency She will find out if any other source of enzyme other than pork, after discussion with her Imam, she agree to restart  Zenpep.  Neutropenia (HCC) I will repeat CBC today.  GERD (gastroesophageal reflux disease) I will send prescription of protonix.     Return in about 2 months (around 08/22/2023).   Eloisa Northern, MD

## 2023-06-22 NOTE — Assessment & Plan Note (Addendum)
She has gained 7 pounds since last visit. I will do TSH

## 2023-06-22 NOTE — Assessment & Plan Note (Signed)
She will find out if any other source of enzyme other than pork, after discussion with her Imam, she agree to restart  Zenpep.

## 2023-06-23 LAB — CBC WITH DIFFERENTIAL/PLATELET
Basophils Absolute: 0 10*3/uL (ref 0.0–0.2)
Basos: 1 %
EOS (ABSOLUTE): 0 10*3/uL (ref 0.0–0.4)
Eos: 1 %
Hematocrit: 35.3 % (ref 34.0–46.6)
Hemoglobin: 11.8 g/dL (ref 11.1–15.9)
Immature Grans (Abs): 0 10*3/uL (ref 0.0–0.1)
Immature Granulocytes: 0 %
Lymphocytes Absolute: 1.2 10*3/uL (ref 0.7–3.1)
Lymphs: 49 %
MCH: 29.5 pg (ref 26.6–33.0)
MCHC: 33.4 g/dL (ref 31.5–35.7)
MCV: 88 fL (ref 79–97)
Monocytes Absolute: 0.2 10*3/uL (ref 0.1–0.9)
Monocytes: 7 %
Neutrophils Absolute: 1 10*3/uL — ABNORMAL LOW (ref 1.4–7.0)
Neutrophils: 42 %
Platelets: 160 10*3/uL (ref 150–450)
RBC: 4 x10E6/uL (ref 3.77–5.28)
RDW: 12.7 % (ref 11.7–15.4)
WBC: 2.4 10*3/uL — CL (ref 3.4–10.8)

## 2023-06-23 LAB — CMP14 + ANION GAP
ALT: 61 IU/L — ABNORMAL HIGH (ref 0–32)
AST: 32 IU/L (ref 0–40)
Albumin: 4.2 g/dL (ref 3.9–4.9)
Alkaline Phosphatase: 50 IU/L (ref 44–121)
Anion Gap: 14 mmol/L (ref 10.0–18.0)
BUN/Creatinine Ratio: 15 (ref 9–23)
BUN: 12 mg/dL (ref 6–24)
Bilirubin Total: 1.3 mg/dL — ABNORMAL HIGH (ref 0.0–1.2)
CO2: 24 mmol/L (ref 20–29)
Calcium: 9.6 mg/dL (ref 8.7–10.2)
Chloride: 102 mmol/L (ref 96–106)
Creatinine, Ser: 0.78 mg/dL (ref 0.57–1.00)
Globulin, Total: 2.4 g/dL (ref 1.5–4.5)
Glucose: 134 mg/dL — ABNORMAL HIGH (ref 70–99)
Potassium: 4.5 mmol/L (ref 3.5–5.2)
Sodium: 140 mmol/L (ref 134–144)
Total Protein: 6.6 g/dL (ref 6.0–8.5)
eGFR: 94 mL/min/{1.73_m2} (ref >59)

## 2023-06-23 LAB — TSH: TSH: 1.35 u[IU]/mL (ref 0.450–4.500)

## 2023-06-23 MED ORDER — PANTOPRAZOLE SODIUM 40 MG PO TBEC: 40.0000 mg | DELAYED_RELEASE_TABLET | Freq: Every day | ORAL | 4 refills | Status: DC

## 2023-06-23 NOTE — Assessment & Plan Note (Signed)
I will send prescription of protonix.

## 2023-06-23 NOTE — Addendum Note (Signed)
Addended byEloisa Northern on: 06/23/2023 06:35 PM   Modules accepted: Orders

## 2023-07-19 ENCOUNTER — Other Ambulatory Visit: Payer: Self-pay | Admitting: Internal Medicine

## 2023-08-21 ENCOUNTER — Ambulatory Visit: Payer: BC Managed Care – PPO | Admitting: Internal Medicine

## 2023-08-29 ENCOUNTER — Ambulatory Visit: Payer: BC Managed Care – PPO | Admitting: Internal Medicine

## 2023-08-29 ENCOUNTER — Encounter: Payer: Self-pay | Admitting: Internal Medicine

## 2023-08-29 VITALS — BP 120/80 | HR 81 | Temp 98.7°F | Resp 18 | Ht 67.0 in | Wt 105.5 lb

## 2023-08-29 DIAGNOSIS — M1990 Unspecified osteoarthritis, unspecified site: Secondary | ICD-10-CM

## 2023-08-29 DIAGNOSIS — K8689 Other specified diseases of pancreas: Secondary | ICD-10-CM

## 2023-08-29 DIAGNOSIS — K219 Gastro-esophageal reflux disease without esophagitis: Secondary | ICD-10-CM

## 2023-08-29 DIAGNOSIS — D708 Other neutropenia: Secondary | ICD-10-CM | POA: Diagnosis not present

## 2023-08-29 DIAGNOSIS — R627 Adult failure to thrive: Secondary | ICD-10-CM | POA: Diagnosis not present

## 2023-08-29 DIAGNOSIS — F3341 Major depressive disorder, recurrent, in partial remission: Secondary | ICD-10-CM

## 2023-08-29 MED ORDER — PANTOPRAZOLE SODIUM 40 MG PO TBEC: 40.0000 mg | DELAYED_RELEASE_TABLET | Freq: Every day | ORAL | 4 refills | Status: DC

## 2023-08-29 MED ORDER — ESCITALOPRAM OXALATE 10 MG PO TABS: 10.0000 mg | ORAL_TABLET | Freq: Every day | ORAL | 2 refills | Status: DC

## 2023-08-29 MED ORDER — DICLOFENAC SODIUM 1 % EX GEL: 2.0000 g | Freq: Four times a day (QID) | CUTANEOUS | 3 refills | Status: DC

## 2023-08-29 NOTE — Assessment & Plan Note (Signed)
I will stop remeron and start her on lexapro 10 mg daily.

## 2023-08-29 NOTE — Assessment & Plan Note (Signed)
Will repeat labs on next visit

## 2023-08-29 NOTE — Assessment & Plan Note (Signed)
She will eat small frequent meal.

## 2023-08-29 NOTE — Assessment & Plan Note (Signed)
She will use voltaren gel three time a day, if stiffness is not better then she will do blood test for RA.

## 2023-08-29 NOTE — Progress Notes (Signed)
Office Visit  Subjective   Patient ID: Tonya Summers   DOB: 04-17-1976   Age: 47 y.o.   MRN: [id number]   Chief Complaint For follow up   History of Present Illness 47 years old female is here for follow up. She is c/o epigastric pain when she is [place]. She has stopped taking pantoprazole. She has stopped taking zenpap also because of pork issue. Even though she has spoken with her imam who suggested to her it is ok to that medicine but she says she does not feel comfortable.   She stay under stress all the time. She read a lot about every thing and that make her more worried.   says that zenpep has pork in it so she stop using that. She says since she stop that capsule, she has diarrhea and fstool float in toilet. She is asking if there is any thing else.     She says her appetite is also better, she eats small meals and takes snack. She has gained 3 pounds since last visit.   I have reviewed her labs with her, her all labs were ok except WBC was 2.4 on 04/20/23. She has appointment to see hematologist in November 24. I have spoken with her about conversation with hematologist at [facility], as her national status improve, hopefully her WBC will also improve. She is worried about neutropenia associated with Remeron. She has neutropenia before she was started her on Remeron.    She looks better today and her husband tells me that she started doing better with new medications. Her depression is better with remeron and she also is sleeping better.    She has GERD and is asking for refill of her PPI, she don't use it every day.   She is also is c/o stiffness and pain in her both hands.   Past Medical History Past Medical History:  Diagnosis Date   Asthma    GERD (gastroesophageal reflux disease)      Allergies Allergies  Allergen Reactions   No Known Allergies      Review of Systems Review of Systems  Constitutional: Negative.   Respiratory: Negative.     Cardiovascular: Negative.   Gastrointestinal:  Positive for abdominal pain and heartburn.  Musculoskeletal:  Positive for joint pain.       Objective:    Vitals There were no vitals taken for this visit.   Physical Examination Physical Exam Constitutional:      Appearance: Normal appearance.  HENT:     Head: Normocephalic and atraumatic.  Cardiovascular:     Rate and Rhythm: Normal rate and regular rhythm.     Heart sounds: Normal heart sounds.  Pulmonary:     Effort: Pulmonary effort is normal.     Breath sounds: Normal breath sounds.  Neurological:     General: No focal deficit present.     Mental Status: She is alert and oriented to person, place, and time.        Assessment & Plan:   Adult failure to thrive She will eat small frequent meal.  Neutropenia (HCC)  Will repeat labs on next visit  Arthritis She will use voltaren gel three time a day, if stiffness is not better then she will do blood test for RA.  Recurrent major depressive disorder, in partial remission (HCC) I will stop remeron and start her on lexapro 10 mg daily.    Return in about 3 months (around 11/29/2023).   Jason Fila  Nelson Chimes, MD

## 2023-11-24 ENCOUNTER — Other Ambulatory Visit: Payer: Self-pay | Admitting: Internal Medicine

## 2023-11-29 ENCOUNTER — Encounter: Payer: Self-pay | Admitting: Internal Medicine

## 2023-11-29 ENCOUNTER — Ambulatory Visit: Payer: BC Managed Care – PPO | Admitting: Internal Medicine

## 2023-11-29 VITALS — BP 122/78 | HR 73 | Temp 98.0°F | Resp 18 | Ht 67.0 in | Wt 121.4 lb

## 2023-11-29 DIAGNOSIS — M130 Polyarthritis, unspecified: Secondary | ICD-10-CM

## 2023-11-29 DIAGNOSIS — D709 Neutropenia, unspecified: Secondary | ICD-10-CM | POA: Diagnosis not present

## 2023-11-29 DIAGNOSIS — R627 Adult failure to thrive: Secondary | ICD-10-CM

## 2023-11-29 DIAGNOSIS — K219 Gastro-esophageal reflux disease without esophagitis: Secondary | ICD-10-CM

## 2023-11-29 MED ORDER — PANTOPRAZOLE SODIUM 40 MG PO TBEC: 40.0000 mg | DELAYED_RELEASE_TABLET | Freq: Every day | ORAL | 4 refills | Status: DC

## 2023-11-29 MED ORDER — MELOXICAM 7.5 MG PO TABS: 7.5000 mg | ORAL_TABLET | Freq: Every day | ORAL | 2 refills | Status: DC

## 2023-11-29 NOTE — Assessment & Plan Note (Signed)
As she is feeling better so I will repeat CBC

## 2023-11-29 NOTE — Progress Notes (Signed)
Office Visit  Subjective   Patient ID: Tonya Summers   DOB: [birth year]   Age: 48 y.o.   MRN: [id number]   Chief Complaint Chief Complaint  Patient presents with   Pancreatic insufficiency    3 month follow up     History of Present Illness 48 years old female is here for follow up. She is c/o stiffness in her joint, hands are stiffness in the morning with pain in her knuckles. She has pain in her shoulders, knees. Pain started more than 3 months ago. Pain is every day in the morning and night.  She has same problem few years ago when she was investigated for rheumatoid arthritis and some of her labs were abnormal at that time.  She could not have further investigation because of her adult failure to thrive and weight loss.   She has gained close to 20 pounds since last visit.  She is eating normally and she says that her stool does not float in toilet anymore.  She does not take any medicine for pancreatic insufficiency.  She says that she feels good.  Her stress and depression is also better.  Her husband who is with the patient says that she is doing much better.  She is actually looking for a job as well.      Has neutropenia where her WBC was 2.45 months ago, 2.47 months ago and 3.63 years ago.  Her anemia has been corrected.  She was referred to see hematologist but she never went to see them as she says that she never received any call.  I have spoken with Dr. Welton Flakes who suggested to repeat after she has a weight gain and if neutropenia still persists then she need to be referred back.  She has vitamin D deficiency and take 2000 [employer] unit daily along with multivitamin.  Past Medical History Past Medical History:  Diagnosis Date   Asthma    GERD (gastroesophageal reflux disease)      Allergies Allergies  Allergen Reactions   No Known Allergies      Review of Systems Review of Systems  Constitutional: Negative.   HENT: Negative.    Respiratory: Negative.     Cardiovascular: Negative.   Gastrointestinal: Negative.   Musculoskeletal:  Positive for joint pain.  Neurological: Negative.        Objective:    Vitals BP 122/78 (BP Location: Left Arm, Patient Position: Sitting, Cuff Size: Normal)   Pulse 73   Temp 98 F (36.7 C)   Resp 18   Ht 5\' 7"  (1.702 m)   Wt 121 lb 6 oz (55.1 kg)   SpO2 99%   BMI 19.01 kg/m    Physical Examination Physical Exam Constitutional:      Appearance: Normal appearance.  HENT:     Head: Normocephalic and atraumatic.  Eyes:     Extraocular Movements: Extraocular movements intact.     Pupils: Pupils are equal, round, and reactive to light.  Cardiovascular:     Rate and Rhythm: Normal rate and regular rhythm.  Pulmonary:     Effort: Pulmonary effort is normal.     Breath sounds: Normal breath sounds.  Abdominal:     General: Bowel sounds are normal.     Palpations: Abdomen is soft.  Neurological:     General: No focal deficit present.     Mental Status: She is alert and oriented to person, place, and time.        Assessment &  Plan:   Neutropenia (HCC) As she is feeling better so I will repeat CBC  Adult failure to thrive He has gained 20 pounds and he feel better. Will continue to monitor.  Polyarthritis She will take tylenol arthritis twice a day and meloxicam as needed basis. I will investigate for RA  GERD (gastroesophageal reflux disease) She is taking pantoprazole and does not have epigastric pain anymore.    Return in about 3 months (around 02/27/2024).   Eloisa Northern, MD

## 2023-11-29 NOTE — Assessment & Plan Note (Signed)
She will take tylenol arthritis twice a day and meloxicam as needed basis. I will investigate for RA

## 2023-11-29 NOTE — Assessment & Plan Note (Signed)
She is taking pantoprazole and does not have epigastric pain anymore.

## 2023-11-29 NOTE — Assessment & Plan Note (Addendum)
He has gained 20 pounds and he feel better. Will continue to monitor.

## 2023-12-03 LAB — ANTI-CCP AB, IGG + IGA (RDL): Anti-CCP Ab, IgG + IgA (RDL): <20 U (ref <20)

## 2023-12-04 LAB — CBC WITH DIFFERENTIAL/PLATELET
Basophils Absolute: 0 10*3/uL (ref 0.0–0.2)
Basos: 1 %
EOS (ABSOLUTE): 0.1 10*3/uL (ref 0.0–0.4)
Eos: 2 %
Hematocrit: 36.5 % (ref 34.0–46.6)
Hemoglobin: 12.1 g/dL (ref 11.1–15.9)
Immature Grans (Abs): 0 10*3/uL (ref 0.0–0.1)
Immature Granulocytes: 0 %
Lymphocytes Absolute: 1.1 10*3/uL (ref 0.7–3.1)
Lymphs: 41 %
MCH: 29.8 pg (ref 26.6–33.0)
MCHC: 33.2 g/dL (ref 31.5–35.7)
MCV: 90 fL (ref 79–97)
Monocytes Absolute: 0.2 10*3/uL (ref 0.1–0.9)
Monocytes: 8 %
Neutrophils Absolute: 1.2 10*3/uL — ABNORMAL LOW (ref 1.4–7.0)
Neutrophils: 48 %
Platelets: 128 10*3/uL — ABNORMAL LOW (ref 150–450)
RBC: 4.06 x10E6/uL (ref 3.77–5.28)
RDW: 12.8 % (ref 11.7–15.4)
WBC: 2.6 10*3/uL — ABNORMAL LOW (ref 3.4–10.8)

## 2023-12-04 LAB — CMP14 + ANION GAP
ALT: 22 [IU]/L (ref 0–32)
AST: 24 [IU]/L (ref 0–40)
Albumin: 4.7 g/dL (ref 3.9–4.9)
Alkaline Phosphatase: 55 [IU]/L (ref 44–121)
BUN/Creatinine Ratio: 23 (ref 9–23)
BUN: 19 mg/dL (ref 6–24)
Bilirubin Total: 0.8 mg/dL (ref 0.0–1.2)
CO2: 20 mmol/L (ref 20–29)
Calcium: 10 mg/dL (ref 8.7–10.2)
Creatinine, Ser: 0.81 mg/dL (ref 0.57–1.00)
Globulin, Total: 2.5 g/dL (ref 1.5–4.5)
Glucose: 104 mg/dL — ABNORMAL HIGH (ref 70–99)
Total Protein: 7.2 g/dL (ref 6.0–8.5)
eGFR: 89 mL/min/{1.73_m2} (ref >59)

## 2023-12-04 LAB — SEDIMENTATION RATE: Sed Rate: 9 mm/h (ref 0–32)

## 2023-12-04 LAB — C-REACTIVE PROTEIN: CRP: <1 mg/L (ref 0–10)

## 2023-12-04 LAB — ANTINUCLEAR ANTIBODIES, IFA: ANA Titer 1: NEGATIVE

## 2023-12-04 LAB — RHEUMATOID FACTOR: Rheumatoid fact SerPl-aCnc: <10 [IU]/mL (ref <14.0)

## 2024-02-20 ENCOUNTER — Ambulatory Visit: Payer: BC Managed Care – PPO | Admitting: Internal Medicine

## 2024-03-17 ENCOUNTER — Encounter: Payer: Self-pay | Admitting: Internal Medicine

## 2024-03-17 ENCOUNTER — Ambulatory Visit: Admitting: Internal Medicine

## 2024-03-17 VITALS — BP 120/78 | HR 87 | Temp 97.9°F | Ht 67.0 in | Wt 121.0 lb

## 2024-03-17 DIAGNOSIS — N6311 Unspecified lump in the right breast, upper outer quadrant: Secondary | ICD-10-CM | POA: Insufficient documentation

## 2024-03-17 DIAGNOSIS — F419 Anxiety disorder, unspecified: Secondary | ICD-10-CM | POA: Insufficient documentation

## 2024-03-17 MED ORDER — PANTOPRAZOLE SODIUM 40 MG PO TBEC: 40.0000 mg | DELAYED_RELEASE_TABLET | Freq: Every day | ORAL | 4 refills | Status: DC

## 2024-03-17 MED ORDER — ESCITALOPRAM OXALATE 10 MG PO TABS: 10.0000 mg | ORAL_TABLET | Freq: Every day | ORAL | 2 refills | Status: DC

## 2024-03-17 NOTE — Assessment & Plan Note (Signed)
 I will restart escitalopram  10 mg daily

## 2024-03-17 NOTE — Progress Notes (Signed)
   Acute Office Visit  Subjective:     Patient ID: Tonya Summers, female    DOB: 07-Dec-1975, 48 y.o.   MRN: [id number]  Chief Complaint  Patient presents with   office visit    Patient having pain in her right breast since 2 weeks     HPI Patient is in today for painful  Small not in her right breast.  She says that she has seen thing 2 years ago had at that time they did biopsy and that was negative.  But she noticed that not outer side of right breast 4 month ago.  It is not growing in size.  She says that it hurt to touch.  No drainage.  She does not have any periods.  She is reluctant to have mammogram and want only ultrasound to be done  Because of pain.    She says that she has started wearing again and her appetite is gone she is also very nervous.  She stopped taking escitalopram   As she was feeling much better.  She says that she does not have appetite anymore.  Review of Systems  Constitutional: Negative.   Respiratory: Negative.    Cardiovascular: Negative.         Objective:    BP 120/78   Pulse 87   Temp 97.9 F (36.6 C)   Ht 5\' 7"  (1.702 m)   Wt 121 lb (54.9 kg)   SpO2 97%   BMI 18.95 kg/m    Physical Exam Constitutional:      Appearance: Normal appearance.  Chest:  Breasts:    Right: Mass and tenderness present.       Comments:    Small lump right breast  That is tender to touch. Neurological:     General: No focal deficit present.     Mental Status: She is alert and oriented to person, place, and time.     No results found for any visits on 03/17/24.      Assessment & Plan:   Problem List Items Addressed This Visit       Other   Mass of upper outer quadrant of right breast - Primary   I will schedule her diagnostic mammogram      Anxiety     I will restart escitalopram  10 mg daily      Relevant Medications   escitalopram  (LEXAPRO ) 10 MG tablet    No orders of the defined types were placed in this encounter.   Return in about  3 months (around 06/17/2024).  Tita Form, MD

## 2024-03-17 NOTE — Assessment & Plan Note (Signed)
 I will schedule her diagnostic mammogram

## 2024-03-18 ENCOUNTER — Other Ambulatory Visit: Payer: Self-pay | Admitting: Internal Medicine

## 2024-03-18 DIAGNOSIS — N6311 Unspecified lump in the right breast, upper outer quadrant: Secondary | ICD-10-CM

## 2024-03-27 ENCOUNTER — Ambulatory Visit
Admission: RE | Admit: 2024-03-27 | Discharge: 2024-03-27 | Disposition: A | Discharge status: Home / Self Care | Source: Ambulatory Visit | Attending: Internal Medicine | Admitting: Internal Medicine

## 2024-03-27 ENCOUNTER — Other Ambulatory Visit: Payer: Self-pay | Admitting: Internal Medicine

## 2024-03-27 DIAGNOSIS — N6311 Unspecified lump in the right breast, upper outer quadrant: Secondary | ICD-10-CM

## 2024-03-27 DIAGNOSIS — R921 Mammographic calcification found on diagnostic imaging of breast: Secondary | ICD-10-CM | POA: Diagnosis not present

## 2024-03-27 DIAGNOSIS — N6310 Unspecified lump in the right breast, unspecified quadrant: Secondary | ICD-10-CM

## 2024-03-27 DIAGNOSIS — N6315 Unspecified lump in the right breast, overlapping quadrants: Secondary | ICD-10-CM | POA: Diagnosis not present

## 2024-03-28 ENCOUNTER — Ambulatory Visit
Admission: RE | Admit: 2024-03-28 | Discharge: 2024-03-28 | Disposition: A | Discharge status: Home / Self Care | Source: Ambulatory Visit | Attending: Internal Medicine | Admitting: Internal Medicine

## 2024-03-28 DIAGNOSIS — N6315 Unspecified lump in the right breast, overlapping quadrants: Secondary | ICD-10-CM | POA: Diagnosis not present

## 2024-03-28 DIAGNOSIS — R921 Mammographic calcification found on diagnostic imaging of breast: Secondary | ICD-10-CM | POA: Diagnosis not present

## 2024-03-28 DIAGNOSIS — N6311 Unspecified lump in the right breast, upper outer quadrant: Secondary | ICD-10-CM | POA: Diagnosis not present

## 2024-03-28 DIAGNOSIS — R928 Other abnormal and inconclusive findings on diagnostic imaging of breast: Secondary | ICD-10-CM | POA: Diagnosis not present

## 2024-03-28 DIAGNOSIS — N6310 Unspecified lump in the right breast, unspecified quadrant: Secondary | ICD-10-CM

## 2024-03-28 DIAGNOSIS — N644 Mastodynia: Secondary | ICD-10-CM | POA: Diagnosis not present

## 2024-03-28 DIAGNOSIS — C50811 Malignant neoplasm of overlapping sites of right female breast: Secondary | ICD-10-CM | POA: Diagnosis not present

## 2024-03-28 DIAGNOSIS — N6341 Unspecified lump in right breast, subareolar: Secondary | ICD-10-CM | POA: Diagnosis not present

## 2024-03-28 DIAGNOSIS — D0511 Intraductal carcinoma in situ of right breast: Secondary | ICD-10-CM | POA: Diagnosis not present

## 2024-03-28 HISTORY — PX: BREAST BIOPSY: SHX20

## 2024-03-31 ENCOUNTER — Telehealth: Payer: Self-pay | Admitting: Radiation Oncology

## 2024-03-31 ENCOUNTER — Encounter: Payer: Self-pay | Admitting: *Deleted

## 2024-03-31 ENCOUNTER — Other Ambulatory Visit: Payer: Self-pay | Admitting: *Deleted

## 2024-03-31 DIAGNOSIS — C50411 Malignant neoplasm of upper-outer quadrant of right female breast: Secondary | ICD-10-CM | POA: Insufficient documentation

## 2024-03-31 LAB — SURGICAL PATHOLOGY

## 2024-03-31 NOTE — Telephone Encounter (Signed)
 6/2 Patient's husband requested appt reminder to be sent to [address] [place], [place] [address]

## 2024-04-01 ENCOUNTER — Encounter: Payer: Self-pay | Admitting: *Deleted

## 2024-04-02 ENCOUNTER — Telehealth: Payer: Self-pay | Admitting: Hematology and Oncology

## 2024-04-02 NOTE — Telephone Encounter (Signed)
 Abdalla called in to confirm Tonya Summers's appointments.

## 2024-04-03 ENCOUNTER — Telehealth: Payer: Self-pay

## 2024-04-03 ENCOUNTER — Ambulatory Visit: Payer: Self-pay | Admitting: General Surgery

## 2024-04-03 DIAGNOSIS — Z170 Estrogen receptor positive status [ER+]: Secondary | ICD-10-CM

## 2024-04-03 DIAGNOSIS — C50411 Malignant neoplasm of upper-outer quadrant of right female breast: Secondary | ICD-10-CM | POA: Diagnosis not present

## 2024-04-03 MED ORDER — KETOROLAC TROMETHAMINE 15 MG/ML IJ SOLN: 15.0000 mg | INTRAMUSCULAR | Status: AC

## 2024-04-03 NOTE — Telephone Encounter (Signed)
 Verbally confirmed appt for 6/9

## 2024-04-04 NOTE — Progress Notes (Signed)
 Location of Breast Cancer: Malignant Neoplasm of Upper-Outer Quadrant of Right Breast, Estrogen Receptor Positive   Histology per Pathology Report:    Receptor Status: ER(Positive), PR (Positive), Her2-neu (Negative), Ki-67(10%)  Did patient present with symptoms (if so, please note symptoms) or was this found on screening mammography?:  Right breast pain 03/27/2024 Mammogram  Past/Anticipated interventions by surgeon, if any:   Breast Lumpectomy with Radioactive Seed and Sentinel Lymph Node Biopsy  Past/Anticipated interventions by medical oncology, if any:    Radiation Therapy with Dr. Lurena Sally Lymphedema issues, if any:  None   Pain issues, if any:  None  SAFETY ISSUES: Prior radiation? None Pacemaker/ICD? None Possible current pregnancy?None Is the patient on methotrexate? None  Current Complaints / other details:  None    BP 134/62 (BP Location: Left Arm, Patient Position: Sitting)   Pulse 85   Temp 97.8 F (36.6 C) (Temporal)   Resp 18   Ht 5\' 7"  (1.702 m)   Wt 120 lb 8 oz (54.7 kg)   SpO2 100%   BMI 18.87 kg/m      Wt Readings from Last 3 Encounters:  04/07/24 120 lb 8 oz (54.7 kg)  04/07/24 120 lb 1.6 oz (54.5 kg)  03/17/24 121 lb (54.9 kg)

## 2024-04-05 NOTE — Progress Notes (Signed)
 Radiation Oncology         ([phone]) [phone] ________________________________  Initial Outpatient Consultation  Name: Shirlie Enck MRN: [id number]  Date: 04/07/2024  DOB: 05-23-1976  NW:GNFA, Dann Dust, MD  Murleen Arms, MD   REFERRING PHYSICIAN: Murleen Arms, MD  DIAGNOSIS: No diagnosis found.   Cancer Staging  No matching staging information was found for the patient.  Stage *** Right Breast UOQ, Invasive and in situ ductal carcinoma, ER+ / PR+ / Her2-, Grade 2  CHIEF COMPLAINT: Here to discuss management of right breast cancer  HISTORY OF PRESENT ILLNESS::Brittane Pong is a 48 y.o. female who presented to medical attention last month (May 2025) with focal upper outer right breast pain. She subsequently presented for a bilateral diagnostic mammogram and right breast ultrasound on 03/27/24 which demonstrated: a suspicious mass in the 9 o'clock retroareolar right breast measuring 1.4 cm with associated loosely grouped round calcifications; and a second indeterminate smaller mass in the 9 o'clock right breast located 5 cmfn measuring 0.9 cm in the greatest extent. Imaging otherwise showed no mammographic evidence of malignancy in the left breast and no evidence of right axillary lymphadenopathy.  (Of note: She did have an area of atypical ductal hyperplasia removed from the right breast in 2017).   Biopsies collected on 03/28/24 are detailed as follows:  -- Biopsy of the 9 o'clock right breast mass located 5 cmfn showed grade 2 invasive ductal carcinoma measuring 1.0 cm in the greatest linear extent of the sample, with intermediate grade DCIS; negative for LVI.  ER status: 100% positive with strong staining intensity; PR status 30% positive with moderate-strong staining intensity; Proliferation marker Ki67 at 10%; Her2 status negative; Grade 2. -- Biopsy of the larger 9 o'clock retroareolar right breast mass showed intermediate to high grade DCIS with papillary features measuring 0.5 cm in the  greatest linear extent of the sample.  ER status: 100% positive with strong staining intensity; PR status 10% positive with moderate-strong staining intensity; Her2 not assessed. -- No lymph nodes were examined.   She was accordingly referred to Dr. Alethea Andes of 04/03/24 to discuss surgical treatment options and she has opted to proceed with breast conserving surgery w/ right axillary SLN excisions. (Dr. Alethea Andes did recommend a breast MRI to further evaluate the two areas in the right breast  however the patient declined).   She also met with Dr. Arno Bibles earlier today to discuss antiestrogen treatment options.   ***  PREVIOUS RADIATION THERAPY: No  PAST MEDICAL HISTORY:  has a past medical history of Asthma and GERD (gastroesophageal reflux disease).    PAST SURGICAL HISTORY: Past Surgical History:  Procedure Laterality Date   BREAST BIOPSY Right 07/2015   BREAST BIOPSY Right 03/28/2024   US  RT BREAST BX W LOC DEV EA ADD LESION IMG BX SPEC US  GUIDE 03/28/2024 [facility]   BREAST BIOPSY Right 03/28/2024   US  RT BREAST BX W LOC DEV 1ST LESION IMG BX SPEC US  GUIDE 03/28/2024 [facility]   BREAST EXCISIONAL BIOPSY Right 10/2015   CHOLECYSTECTOMY     RADIOACTIVE SEED GUIDED EXCISIONAL BREAST BIOPSY Right 11/25/2015   Procedure: RADIOACTIVE SEED GUIDED EXCISIONAL BREAST BIOPSY;  Surgeon: Lockie Rima, MD;  Location: [facility];  Service: General;  Laterality: Right;    FAMILY HISTORY: family history is not on file.  SOCIAL HISTORY:  reports that she has never smoked. She has never used smokeless tobacco. She reports that she does not drink alcohol and does not use drugs.  ALLERGIES:  No known allergies  MEDICATIONS:  Current Outpatient Medications  Medication Sig Dispense Refill   diclofenac  Sodium (VOLTAREN  ARTHRITIS PAIN) 1 % GEL Apply 2 g topically 4 (four) times daily. 150 g 3   escitalopram  (LEXAPRO ) 10 MG tablet Take 1 tablet (10 mg total) by mouth daily. 30 tablet  2   meloxicam  (MOBIC ) 7.5 MG tablet Take 1 tablet (7.5 mg total) by mouth daily. 30 tablet 2   pantoprazole  (PROTONIX ) 40 MG tablet Take 1 tablet (40 mg total) by mouth daily. 30 tablet 4   No current facility-administered medications for this encounter.    REVIEW OF SYSTEMS: As above in HPI.   PHYSICAL EXAM:  vitals were not taken for this visit.   General: Alert and oriented, in no acute distress HEENT: Head is normocephalic. Extraocular movements are intact.  Heart: Regular in rate and rhythm with no murmurs, rubs, or gallops. Chest: Clear to auscultation bilaterally, with no rhonchi, wheezes, or rales. Abdomen: Soft, nontender, nondistended, with no rigidity or guarding. Extremities: No cyanosis or edema. Skin: No concerning lesions. Musculoskeletal: symmetric strength and muscle tone throughout. Neurologic: Cranial nerves II through XII are grossly intact. No obvious focalities. Speech is fluent. Coordination is intact. Psychiatric: Judgment and insight are intact. Affect is appropriate. Breasts: *** . No other palpable masses appreciated in the breasts or axillae *** .    ECOG = ***  0 - Asymptomatic (Fully active, able to carry on all predisease activities without restriction)  1 - Symptomatic but completely ambulatory (Restricted in physically strenuous activity but ambulatory and able to carry out work of a light or sedentary nature. For example, light housework, office work)  2 - Symptomatic, <50% in bed during the day (Ambulatory and capable of all self care but unable to carry out any work activities. Up and about more than 50% of waking hours)  3 - Symptomatic, >50% in bed, but not bedbound (Capable of only limited self-care, confined to bed or chair 50% or more of waking hours)  4 - Bedbound (Completely disabled. Cannot carry on any self-care. Totally confined to bed or chair)  5 - Death   Aurea Blossom MM, Creech RH, Tormey DC, et al. [phone]). "Toxicity and response  criteria of the [facility]". Am. Hillard Lowes. Oncol. 5 (6): 649-55   LABORATORY DATA:   CBC    Component Value Date/Time   WBC 2.6 (L) 11/29/2023 0958   WBC 3.6 (L) 05/04/2020 1531   RBC 4.06 11/29/2023 0958   RBC 4.31 05/04/2020 1531   HGB 12.1 11/29/2023 0958   HCT 36.5 11/29/2023 0958   PLT 128 (L) 11/29/2023 0958   MCV 90 11/29/2023 0958   MCH 29.8 11/29/2023 0958   MCH 29.0 05/04/2020 1531   MCHC 33.2 11/29/2023 0958   MCHC 32.3 05/04/2020 1531   RDW 12.8 11/29/2023 0958   LYMPHSABS 1.1 11/29/2023 0958   MONOABS 0.3 05/04/2020 1531   EOSABS 0.1 11/29/2023 0958   BASOSABS 0.0 11/29/2023 0958    CMP     Component Value Date/Time   NA CANCELED 11/29/2023 0958   K CANCELED 11/29/2023 0958   CL CANCELED 11/29/2023 0958   CO2 20 11/29/2023 0958   GLUCOSE 104 (H) 11/29/2023 0958   GLUCOSE 103 (H) 05/04/2020 1531   BUN 19 11/29/2023 0958   CREATININE 0.81 11/29/2023 0958   CREATININE 0.62 03/29/2015 1712   CALCIUM 10.0 11/29/2023 0958   PROT 7.2 11/29/2023 0958   ALBUMIN 4.7 11/29/2023 0958  AST 24 11/29/2023 0958   ALT 22 11/29/2023 0958   ALKPHOS 55 11/29/2023 0958   BILITOT 0.8 11/29/2023 0958   GFR 141.70 10/19/2015 1019   EGFR 89 11/29/2023 0958   GFRNONAA >60 05/04/2020 1531   GFRNONAA >89 01/11/2015 1258      RADIOGRAPHY: US  RT BREAST BX W LOC DEV 1ST LESION IMG BX SPEC US  GUIDE Addendum Date: 03/31/2024 ADDENDUM REPORT: 03/31/2024 15:34 ADDENDUM: Pathology revealed GRADE II INVASIVE MODERATELY DIFFERENTIATED DUCTAL ADENOCARCINOMA WITH EXTRACELLULAR MUCIN, DUCTAL CARCINOMA IN SITU, INTERMEDIATE NUCLEAR GRADE, CRIBRIFORM AND PAPILLARY TYPES, NEGATIVE FOR MICROCALCIFICATIONS of the RIGHT breast, 9:00 o'clock, 5 cmfn, (coil clip). This was found to be concordant by Dr. Clancy Crimes. Pathology revealed DUCTAL CARCINOMA IN SITU, INTERMEDIATE to HIGH NUCLEAR GRADE, WITH PAPILLARY FEATURES, NEGATIVE FOR MICROCALCIFICATIONS, NEGATIVE FOR  INVASIVE CARCINOMA of the RIGHT breast, 9:00 o'clock, retroareolar, (heart clip). This was found to be concordant by Dr. Clancy Crimes. Pathology results were discussed with the patient's husband Tanish Sinkler by telephone, per request. The patient's husband reported his wife was doing well after the biopsies with moderate tenderness at the sites. Post biopsy instructions and care were reviewed and questions were answered. The patient's husband was encouraged to call [facility] for any additional concerns. My direct phone number was provided. Surgical consultation has been arranged with Dr. Lillette Reid at [facility] on April 03, 2024. Recommendation for a bilateral breast MRI given breast density-C and multifocal malignancy. Pathology results reported by Kraig Peru, RN on 03/31/2024. Electronically Signed   By: Clancy Crimes M.D.   On: 03/31/2024 15:34   Result Date: 03/31/2024 CLINICAL DATA:  New asymmetry with calcifications as well as area painful concern. History of atypical ductal hyperplasia. EXAM: ULTRASOUND GUIDED RIGHT BREAST CORE NEEDLE BIOPSY x2 COMPARISON:  Previous exam(s). PROCEDURE: I met with the patient and patient's husband and we discussed the procedure of ultrasound-guided biopsy, including benefits and alternatives. We discussed the high likelihood of a successful procedure. We discussed the risks of the procedure, including infection, bleeding, tissue injury, clip migration, and inadequate sampling. Informed written consent was given. The usual time-out protocol was performed immediately prior to the procedure. Site 1: 9 o'clock 5 cm from the nipple Lesion quadrant: Upper outer quadrant Using sterile technique and 1% lidocaine  and 1% lidocaine  with epinephrine  as local anesthetic, under direct ultrasound visualization, a 14 gauge spring-loaded device was used to perform biopsy of a mass at 9 o'clock 5 cm from the nipple using a lateral  approach. At the conclusion of the procedure a COIL shaped tissue marker clip was deployed into the biopsy cavity. Follow up 2 view mammogram was performed and dictated separately. Site 2: 9 o'clock retroareolar breast Lesion quadrant: Upper outer quadrant Using sterile technique and 1% lidocaine  and 1% lidocaine  with epinephrine  as local anesthetic, under direct ultrasound visualization, a 14 gauge spring-loaded device was used to perform biopsy of a mass in the retroareolar breast at 9 o'clock using an inferior approach. At the conclusion of the procedure a heart shaped tissue marker clip was deployed into the biopsy cavity. Follow up 2 view mammogram was performed and dictated separately. IMPRESSION: Ultrasound guided biopsy of 2 RIGHT breast masses at 9 o'clock. No apparent complications. Electronically Signed: By: Clancy Crimes M.D. On: 03/28/2024 12:10   US  RT BREAST BX W LOC DEV EA ADD LESION IMG BX SPEC US  GUIDE Addendum Date: 03/31/2024 ADDENDUM REPORT: 03/31/2024 15:34 ADDENDUM: Pathology revealed GRADE II INVASIVE  MODERATELY DIFFERENTIATED DUCTAL ADENOCARCINOMA WITH EXTRACELLULAR MUCIN, DUCTAL CARCINOMA IN SITU, INTERMEDIATE NUCLEAR GRADE, CRIBRIFORM AND PAPILLARY TYPES, NEGATIVE FOR MICROCALCIFICATIONS of the RIGHT breast, 9:00 o'clock, 5 cmfn, (coil clip). This was found to be concordant by Dr. Clancy Crimes. Pathology revealed DUCTAL CARCINOMA IN SITU, INTERMEDIATE to HIGH NUCLEAR GRADE, WITH PAPILLARY FEATURES, NEGATIVE FOR MICROCALCIFICATIONS, NEGATIVE FOR INVASIVE CARCINOMA of the RIGHT breast, 9:00 o'clock, retroareolar, (heart clip). This was found to be concordant by Dr. Clancy Crimes. Pathology results were discussed with the patient's husband Nekeisha Aure by telephone, per request. The patient's husband reported his wife was doing well after the biopsies with moderate tenderness at the sites. Post biopsy instructions and care were reviewed and questions were answered. The  patient's husband was encouraged to call [facility] for any additional concerns. My direct phone number was provided. Surgical consultation has been arranged with Dr. Lillette Reid at [facility] on April 03, 2024. Recommendation for a bilateral breast MRI given breast density-C and multifocal malignancy. Pathology results reported by Kraig Peru, RN on 03/31/2024. Electronically Signed   By: Clancy Crimes M.D.   On: 03/31/2024 15:34   Result Date: 03/31/2024 CLINICAL DATA:  New asymmetry with calcifications as well as area painful concern. History of atypical ductal hyperplasia. EXAM: ULTRASOUND GUIDED RIGHT BREAST CORE NEEDLE BIOPSY x2 COMPARISON:  Previous exam(s). PROCEDURE: I met with the patient and patient's husband and we discussed the procedure of ultrasound-guided biopsy, including benefits and alternatives. We discussed the high likelihood of a successful procedure. We discussed the risks of the procedure, including infection, bleeding, tissue injury, clip migration, and inadequate sampling. Informed written consent was given. The usual time-out protocol was performed immediately prior to the procedure. Site 1: 9 o'clock 5 cm from the nipple Lesion quadrant: Upper outer quadrant Using sterile technique and 1% lidocaine  and 1% lidocaine  with epinephrine  as local anesthetic, under direct ultrasound visualization, a 14 gauge spring-loaded device was used to perform biopsy of a mass at 9 o'clock 5 cm from the nipple using a lateral approach. At the conclusion of the procedure a COIL shaped tissue marker clip was deployed into the biopsy cavity. Follow up 2 view mammogram was performed and dictated separately. Site 2: 9 o'clock retroareolar breast Lesion quadrant: Upper outer quadrant Using sterile technique and 1% lidocaine  and 1% lidocaine  with epinephrine  as local anesthetic, under direct ultrasound visualization, a 14 gauge spring-loaded device was used to perform  biopsy of a mass in the retroareolar breast at 9 o'clock using an inferior approach. At the conclusion of the procedure a heart shaped tissue marker clip was deployed into the biopsy cavity. Follow up 2 view mammogram was performed and dictated separately. IMPRESSION: Ultrasound guided biopsy of 2 RIGHT breast masses at 9 o'clock. No apparent complications. Electronically Signed: By: Clancy Crimes M.D. On: 03/28/2024 12:10   MM CLIP PLACEMENT RIGHT Result Date: 03/28/2024 CLINICAL DATA:  Status post 2 site ultrasound-guided biopsy EXAM: 3D DIAGNOSTIC RIGHT MAMMOGRAM POST ULTRASOUND BIOPSY COMPARISON:  Previous exam(s). ACR Breast Density Category d: The breasts are extremely dense, which lowers the sensitivity of mammography. FINDINGS: 3D Mammographic images were obtained following ultrasound guided biopsy of a mass at 9 o'clock 5 cm from the nipple. The COIL biopsy marking clip is in expected position at the site of biopsy. 3D Mammographic images were obtained following ultrasound guided biopsy of a mass at 9 o'clock in the retroareolar breast. The heart biopsy marking clip is in expected position  at the site of biopsy. This is at the site of asymmetry with calcification concern. Clips span approximately 3 cm. IMPRESSION: 1. Appropriate positioning of the COIL shaped biopsy marking clip at the site of biopsy in the outer breast. 2. Appropriate positioning of the heart shaped biopsy marking clip at the site of biopsy in the outer retroareolar breast. Final Assessment: Post Procedure Mammograms for Marker Placement Electronically Signed   By: Clancy Crimes M.D.   On: 03/28/2024 12:11   MM 3D DIAGNOSTIC MAMMOGRAM BILATERAL BREAST Result Date: 03/27/2024 CLINICAL DATA:  Patient presents for evaluation of focal RIGHT breast pain in the upper-outer quadrant. She is due for BILATERAL mammogram. EXAM: DIGITAL DIAGNOSTIC BILATERAL MAMMOGRAM WITH TOMOSYNTHESIS AND CAD; ULTRASOUND RIGHT BREAST LIMITED  TECHNIQUE: Bilateral digital diagnostic mammography and breast tomosynthesis was performed. The images were evaluated with computer-aided detection. ; Targeted ultrasound examination of the right breast was performed COMPARISON:  Previous exam(s). ACR Breast Density Category c: The breasts are heterogeneously dense, which may obscure small masses. FINDINGS: No suspicious masses, calcifications, architectural distortion or other findings in the LEFT breast. In the RIGHT breast approximate 9 o'clock position there is an approximately 9 mm focal asymmetry with associated new loosely grouped round calcifications. Spot compression views of the area of painful concern in the upper-outer quadrant of the RIGHT breast demonstrate a possible 9 mm superficial underlying asymmetry. This area was evaluated under targeted ultrasound. Targeted ultrasound was performed in the area of patient directed painful concern. There is an oval, hypoechoic mass with indistinct margins measuring 7 x 4 x 9 mm, correlating with the site of focal pain and the mammographic asymmetry at 9 o'clock, 5 cm from the nipple. At the 9 o'clock retroareolar position there is an irregular, hypoechoic mass with indistinct margins measuring 12 x 6 x 14 mm, correlating with the focal asymmetry seen on mammography. Internal echogenic foci correlate with the calcifications seen on mammography. RIGHT axillary ultrasound without suspicious lymph nodes. IMPRESSION: 1. New RIGHT breast mass measuring 12 x 6 x 14 mm, with associated loosely grouped round calcifications at the 9 o'clock retroareolar position in the RIGHT breast requires further characterization with ultrasound-guided biopsy. 2. RIGHT breast mass measuring 7 x 4 x 9 mm at the site of focal pain at 9 o'clock 5 cm from the nipple in the RIGHT breast is indeterminate and requires further characterization with ultrasound-guided biopsy. 3. No mammographic evidence of malignancy in the LEFT breast. No  suspicious RIGHT axillary lymph nodes. RECOMMENDATION: Ultrasound biopsy of the RIGHT breast x2. I have discussed the findings and recommendations with the patient and her husband. The biopsy procedure was discussed with the patient and questions were answered. Patient expressed their understanding of the biopsy recommendation. Patient will be scheduled for biopsy at her earliest convenience by the schedulers. Ordering provider will be notified. If applicable, a reminder letter will be sent to the patient regarding the next appointment. BI-RADS CATEGORY  4: Suspicious. Electronically Signed   By: Shannan Dart M.D.   On: 03/27/2024 15:47   US  LIMITED ULTRASOUND INCLUDING AXILLA RIGHT BREAST Result Date: 03/27/2024 CLINICAL DATA:  Patient presents for evaluation of focal RIGHT breast pain in the upper-outer quadrant. She is due for BILATERAL mammogram. EXAM: DIGITAL DIAGNOSTIC BILATERAL MAMMOGRAM WITH TOMOSYNTHESIS AND CAD; ULTRASOUND RIGHT BREAST LIMITED TECHNIQUE: Bilateral digital diagnostic mammography and breast tomosynthesis was performed. The images were evaluated with computer-aided detection. ; Targeted ultrasound examination of the right breast was performed COMPARISON:  Previous exam(s). ACR  Breast Density Category c: The breasts are heterogeneously dense, which may obscure small masses. FINDINGS: No suspicious masses, calcifications, architectural distortion or other findings in the LEFT breast. In the RIGHT breast approximate 9 o'clock position there is an approximately 9 mm focal asymmetry with associated new loosely grouped round calcifications. Spot compression views of the area of painful concern in the upper-outer quadrant of the RIGHT breast demonstrate a possible 9 mm superficial underlying asymmetry. This area was evaluated under targeted ultrasound. Targeted ultrasound was performed in the area of patient directed painful concern. There is an oval, hypoechoic mass with indistinct margins  measuring 7 x 4 x 9 mm, correlating with the site of focal pain and the mammographic asymmetry at 9 o'clock, 5 cm from the nipple. At the 9 o'clock retroareolar position there is an irregular, hypoechoic mass with indistinct margins measuring 12 x 6 x 14 mm, correlating with the focal asymmetry seen on mammography. Internal echogenic foci correlate with the calcifications seen on mammography. RIGHT axillary ultrasound without suspicious lymph nodes. IMPRESSION: 1. New RIGHT breast mass measuring 12 x 6 x 14 mm, with associated loosely grouped round calcifications at the 9 o'clock retroareolar position in the RIGHT breast requires further characterization with ultrasound-guided biopsy. 2. RIGHT breast mass measuring 7 x 4 x 9 mm at the site of focal pain at 9 o'clock 5 cm from the nipple in the RIGHT breast is indeterminate and requires further characterization with ultrasound-guided biopsy. 3. No mammographic evidence of malignancy in the LEFT breast. No suspicious RIGHT axillary lymph nodes. RECOMMENDATION: Ultrasound biopsy of the RIGHT breast x2. I have discussed the findings and recommendations with the patient and her husband. The biopsy procedure was discussed with the patient and questions were answered. Patient expressed their understanding of the biopsy recommendation. Patient will be scheduled for biopsy at her earliest convenience by the schedulers. Ordering provider will be notified. If applicable, a reminder letter will be sent to the patient regarding the next appointment. BI-RADS CATEGORY  4: Suspicious. Electronically Signed   By: Shannan Dart M.D.   On: 03/27/2024 15:47      IMPRESSION/PLAN: ***   It was a pleasure meeting the patient today. We discussed the risks, benefits, and side effects of radiotherapy. I recommend radiotherapy to the *** to reduce her risk of locoregional recurrence by 2/3.  We discussed that radiation would take approximately *** weeks to complete and that I would give  the patient a few weeks to heal following surgery before starting treatment planning. *** If chemotherapy were to be given, this would precede radiotherapy. We spoke about acute effects including skin irritation and fatigue as well as much less common late effects including internal organ injury or irritation. We spoke about the latest technology that is used to minimize the risk of late effects for patients undergoing radiotherapy to the breast or chest wall. No guarantees of treatment were given. The patient is enthusiastic about proceeding with treatment. I look forward to participating in the patient's care.  I will await her referral back to me for postoperative follow-up and eventual CT simulation/treatment planning.  On date of service, in total, I spent *** minutes on this encounter. Patient was seen in person.   __________________________________________   Colie Dawes, MD  This document serves as a record of services personally performed by Colie Dawes, MD. It was created on her behalf by Aleta Anda, a trained medical scribe. The creation of this record is based on the scribe's personal  observations and the provider's statements to them. This document has been checked and approved by the attending provider.

## 2024-04-07 ENCOUNTER — Other Ambulatory Visit: Payer: Self-pay | Admitting: Genetic Counselor

## 2024-04-07 ENCOUNTER — Inpatient Hospital Stay: Attending: Hematology and Oncology | Admitting: Hematology and Oncology

## 2024-04-07 ENCOUNTER — Ambulatory Visit
Admission: RE | Admit: 2024-04-07 | Discharge: 2024-04-07 | Disposition: A | Discharge status: Home / Self Care | Source: Ambulatory Visit | Attending: Hematology and Oncology | Admitting: Hematology and Oncology

## 2024-04-07 ENCOUNTER — Ambulatory Visit
Admission: RE | Admit: 2024-04-07 | Discharge: 2024-04-07 | Disposition: A | Discharge status: Home / Self Care | Source: Ambulatory Visit | Attending: Radiation Oncology | Admitting: Radiation Oncology

## 2024-04-07 ENCOUNTER — Other Ambulatory Visit: Payer: Self-pay | Admitting: General Surgery

## 2024-04-07 ENCOUNTER — Other Ambulatory Visit: Payer: Self-pay

## 2024-04-07 ENCOUNTER — Inpatient Hospital Stay: Admitting: Genetic Counselor

## 2024-04-07 ENCOUNTER — Encounter: Payer: Self-pay | Admitting: Radiation Oncology

## 2024-04-07 ENCOUNTER — Encounter (HOSPITAL_BASED_OUTPATIENT_CLINIC_OR_DEPARTMENT_OTHER): Payer: Self-pay | Admitting: General Surgery

## 2024-04-07 VITALS — BP 134/62 | HR 85 | Temp 97.8°F | Resp 18 | Ht 67.0 in | Wt 120.5 lb

## 2024-04-07 VITALS — BP 136/62 | HR 84 | Temp 98.4°F | Resp 17 | Wt 120.1 lb

## 2024-04-07 DIAGNOSIS — C50411 Malignant neoplasm of upper-outer quadrant of right female breast: Secondary | ICD-10-CM

## 2024-04-07 DIAGNOSIS — Z1721 Progesterone receptor positive status: Secondary | ICD-10-CM | POA: Diagnosis not present

## 2024-04-07 DIAGNOSIS — Z1732 Human epidermal growth factor receptor 2 negative status: Secondary | ICD-10-CM | POA: Diagnosis not present

## 2024-04-07 DIAGNOSIS — Z170 Estrogen receptor positive status [ER+]: Secondary | ICD-10-CM | POA: Diagnosis not present

## 2024-04-07 LAB — PREGNANCY, URINE: Preg Test, Ur: NEGATIVE

## 2024-04-07 LAB — GENETIC SCREENING ORDER

## 2024-04-07 NOTE — Progress Notes (Signed)
 Patient and husband referred urgently for genetic counseling and testing. Briefly reviewed what genetic testing is and process. Discussed that genetic testing and counseling was recommended urgently as genetic test results can have implications for the types of surgeries recommended for breast cancer treatment. Ms. Kosak surgery is scheduled for this Friday 04/11/24. Family does not have any interest in delaying surgery for results, requested to reschedule genetic counseling appointment after surgery is complete due to being overwhelmed by appointments.   Will have scheduling team reach out to family for new appointment. Encouraged family to reach out to us  with additional questions and concerns.   Less that 10 minutes spent with patient and her spouse.

## 2024-04-07 NOTE — Progress Notes (Signed)
 [facility] CONSULT NOTE  Patient Care Team: Tita Form, MD as PCP - General (Internal Medicine) Claudette Cue, MD (Inactive) as Consulting Physician (Gastroenterology) Abdul Hodgkin, MD as Consulting Physician (Obstetrics and Gynecology)  CHIEF COMPLAINTS/PURPOSE OF CONSULTATION:  Newly diagnosed breast cancer  HISTORY OF PRESENTING ILLNESS:  Tonya Summers 48 y.o. female is here because of recent diagnosis of right breast cancer  I reviewed her records extensively and collaborated the history with the patient.  SUMMARY OF ONCOLOGIC HISTORY: Oncology History  Malignant neoplasm of upper-outer quadrant of right female breast (HCC)  03/27/2024 Mammogram   New RIGHT breast mass measuring 12 x 6 x 14 mm, with associated loosely grouped round calcifications at the 9 o'clock retroareolar position in the RIGHT breast requires further characterization with ultrasound-guided biopsy.   2. RIGHT breast mass measuring 7 x 4 x 9 mm at the site of focal pain at 9 o'clock 5 cm from the nipple in the RIGHT breast is indeterminate and requires further characterization with ultrasound-guided biopsy.   3. No mammographic evidence of malignancy in the LEFT breast. No suspicious RIGHT axillary lymph nodes.   03/28/2024 Pathology Results   1. Breast, right, needle core biopsy, 9:00, 5 cmfn, coil :      INVASIVE MODERATELY DIFFERENTIATED DUCTAL ADENOCARCINOMA WITH EXTRACELLULAR      MUCIN, GRADE 2 (3+2+1)      DUCTAL CARCINOMA IN SITU, INTERMEDIATE NUCLEAR GRADE, CRIBRIFORM AND PAPILLARY      TYPES      TUBULE FORMATION: SCORE 3      NUCLEAR PLEOMORPHISM: SCORE 2      MITOTIC COUNT: SCORE 1      TOTAL SCORE: 6      OVERALL GRADE: GRADE 2 (6/9)      NEGATIVE FOR ANGIOLYMPHATIC INVASION      NEGATIVE FOR MICROCALCIFICATIONS      TUMOR MEASURES 10 MM IN GREATEST LINEAR EXTENT       2. Breast, right, needle core biopsy, 9:00, retroareolar, heart clip :      DUCTAL CARCINOMA  IN SITU, INTERMEDIATE TO HIGH NUCLEAR GRADE, WITH PAPILLARY      FEATURES      NEGATIVE FOR INVASIVE CARCINOMA      NEGATIVE FOR MICROCALCIFICATIONS      TUMOR MEASURES 5 MM IN GREATEST LINEAR EXTENT   Estrogen Receptor:  100%, POSITIVE, STRONG STAINING INTENSITY  Progesterone  Receptor:  30%, POSITIVE, MODERATE-STRONG STAINING INTENSITY  Proliferation Marker Ki67:  10%  The tumor cells are NEGATIVE for Her2 (1+).    03/31/2024 Initial Diagnosis   Malignant neoplasm of upper-outer quadrant of right female breast [facility])    Discussed the use of AI scribe software for clinical note transcription with the patient, who gave verbal consent to proceed.  History of Present Illness Tonya Summers is a 47 year old female who presents with a new diagnosis of invasive ductal carcinoma of the right breast.  She has been experiencing pain in the right breast for approximately six to seven months, which led to a mammogram. The mammogram identified two areas of concern at the nine o'clock position in the right breast. The first mass measured approximately 12 millimeters, and the second mass measured 7 by 4 by 9 millimeters.  A biopsy of these areas was performed. The mass located 5 centimeters from the nipple was diagnosed as invasive ductal carcinoma, while the second area showed noninvasive cancer. The cancer is estrogen and progesterone  receptor-positive and HER2 negative, with a KI-67 score  of 10%, indicating a low growth rate.  She has a history of breast hyperplasia, for which she underwent surgery in 2017. Additionally, she had her gallbladder removed in 2000. She has no family history of cancer. She has one child, aged 101, whom she breastfed with the left breast for one year.  Currently, she is not on any medication related to her breast cancer diagnosis.   MEDICAL HISTORY:  Past Medical History:  Diagnosis Date   Asthma    GERD (gastroesophageal reflux disease)     SURGICAL HISTORY: Past  Surgical History:  Procedure Laterality Date   BREAST BIOPSY Right 07/2015   BREAST BIOPSY Right 03/28/2024   US  RT BREAST BX W LOC DEV EA ADD LESION IMG BX SPEC US  GUIDE 03/28/2024 [facility]MO[facility]   BREAST BIOPSY Right 03/28/2024   US  RT BREAST BX W LOC DEV 1ST LESION IMG BX SPEC US  GUIDE 03/28/2024 [facility]AMMO[facility]   BREAST EXCISIONAL BIOPSY Right 10/2015   CHOLECYSTECTOMY     RADIOACTIVE SEED GUIDED EXCISIONAL BREAST BIOPSY Right 11/25/2015   Procedure: RADIOACTIVE SEED GUIDED EXCISIONAL BREAST BIOPSY;  Surgeon: Lockie Rima, MD;  Location: [facility];  Service: General;  Laterality: Right;    SOCIAL HISTORY: Social History   Socioeconomic History   Marital status: Married    Spouse name: Not on file   Number of children: 1   Years of education: Not on file   Highest education level: Not on file  Occupational History   Not on file  Tobacco Use   Smoking status: Never   Smokeless tobacco: Never  Vaping Use   Vaping status: Never Used  Substance and Sexual Activity   Alcohol use: No    Alcohol/week: 0.0 standard drinks of alcohol   Drug use: No   Sexual activity: Not Currently    Partners: Male    Birth control/protection: None  Other Topics Concern   Not on file  Social History Narrative   Marital status: married      Children: 1 child (50)      Moved from [place] in 2016   Social Drivers of Health   Financial Resource Strain: Not on file  Food Insecurity: No Food Insecurity (04/07/2024)   Hunger Vital Sign    Worried About Radiation protection practitioner of Food in the Last Year: Never true    Ran Out of Food in the Last Year: Never true  Transportation Needs: No Transportation Needs (04/07/2024)   PRAPARE - Administrator, Civil Service (Medical): No    Lack of Transportation (Non-Medical): No  Physical Activity: Not on file  Stress: Not on file  Social Connections: Not on file  Intimate Partner Violence: Not At Risk (04/07/2024)   Humiliation, Afraid, Rape,  and Kick questionnaire    Fear of Current or Ex-Partner: No    Emotionally Abused: No    Physically Abused: No    Sexually Abused: No    FAMILY HISTORY: Family History  Problem Relation Age of Onset   Colon cancer Neg Hx    Stomach cancer Neg Hx    Pancreatic cancer Neg Hx    Esophageal cancer Neg Hx    Rectal cancer Neg Hx     ALLERGIES:  is allergic to no known allergies.  MEDICATIONS:  Current Outpatient Medications  Medication Sig Dispense Refill   diclofenac  Sodium (VOLTAREN  ARTHRITIS PAIN) 1 % GEL Apply 2 g topically 4 (four) times daily. (Patient not taking: Reported on 04/07/2024) 150 g  3   escitalopram  (LEXAPRO ) 10 MG tablet Take 1 tablet (10 mg total) by mouth daily. (Patient not taking: Reported on 04/07/2024) 30 tablet 2   meloxicam  (MOBIC ) 7.5 MG tablet Take 1 tablet (7.5 mg total) by mouth daily. (Patient not taking: Reported on 04/07/2024) 30 tablet 2   pantoprazole  (PROTONIX ) 40 MG tablet Take 1 tablet (40 mg total) by mouth daily. (Patient not taking: Reported on 04/07/2024) 30 tablet 4   No current facility-administered medications for this visit.     All other systems were reviewed with the patient and are negative.  PHYSICAL EXAMINATION: ECOG PERFORMANCE STATUS: 0 - Asymptomatic  Vitals:   04/07/24 1020  BP: 136/62  Pulse: 84  Resp: 17  Temp: 98.4 F (36.9 C)  SpO2: 100%   Filed Weights   04/07/24 1020  Weight: 120 lb 1.6 oz (54.5 kg)    GENERAL:alert, no distress and comfortable Small breasts. Post biopsy changes and palpable mass in right breast at 9 0 clock. No nipple changes. No regional adenopathy Left breast normal to inspection and palpation  LABORATORY DATA:  I have reviewed the data as listed Lab Results  Component Value Date   WBC 2.6 (L) 11/29/2023   HGB 12.1 11/29/2023   HCT 36.5 11/29/2023   MCV 90 11/29/2023   PLT 128 (L) 11/29/2023   Lab Results  Component Value Date   NA CANCELED 11/29/2023   K CANCELED 11/29/2023   CL  CANCELED 11/29/2023   CO2 20 11/29/2023    RADIOGRAPHIC STUDIES: I have personally reviewed the radiological reports and agreed with the findings in the report.  ASSESSMENT AND PLAN:  Malignant neoplasm of upper-outer quadrant of right female breast (HCC) Assessment and Plan Assessment & Plan Invasive ductal carcinoma of right breast Invasive ductal carcinoma, 12 mm, grade 2, stage 1, ER/PR-positive, HER2-negative, KI-67 10%. Low chemotherapy likelihood pending Oncotype results. - Perform lumpectomy and sentinel lymph node biopsy. - Conduct Oncotype test post-surgery. We have discussed about Oncotype Dx score which is a well validated prognostic scoring system which can predict outcome with endocrine therapy alone and whether chemotherapy reduces recurrence.  Typically in patients with ER positive cancers that are node negative if the RS score is high typically greater than or equal to 26, chemotherapy is recommended.  In women with intermediate recurrence score younger than 50, there can still be some role for chemotherapy in addition to endocrine therapy especially if the recurrence score is between 21-25. If chemotherapy is needed, this will precede radiation and then after radiation she will continue on antiestrogen therapy. - Consider adj radiation. - Prescribe tamoxifen for 5-10 years post-radiation. We have discussed options for antiestrogen therapy today. With regards to Tamoxifen, we discussed that this is a SERM, selective estrogen receptor modulator. We discussed mechanism of action of Tamoxifen, adverse effects on Tamoxifen including but not limited to post menopausal symptoms, increased risk of DVT/PE, increased risk of endometrial cancer, questionable cataracts with long term use and increased risk of cardiovascular events in the study which was not statistically significant. A benefit from Tamoxifen would be improvement in bone density. - RTC in approximately 5/6 weeks to review  pathology and oncotype results.    All questions were answered. The patient knows to call the clinic with any problems, questions or concerns.    Murleen Arms, MD 04/07/24

## 2024-04-07 NOTE — Assessment & Plan Note (Signed)
 Assessment and Plan Assessment & Plan Invasive ductal carcinoma of right breast Invasive ductal carcinoma, 12 mm, grade 2, stage 1, ER/PR-positive, HER2-negative, KI-67 10%. Low chemotherapy likelihood pending Oncotype results. - Perform lumpectomy and sentinel lymph node biopsy. - Conduct Oncotype test post-surgery. We have discussed about Oncotype Dx score which is a well validated prognostic scoring system which can predict outcome with endocrine therapy alone and whether chemotherapy reduces recurrence.  Typically in patients with ER positive cancers that are node negative if the RS score is high typically greater than or equal to 26, chemotherapy is recommended.  In women with intermediate recurrence score younger than 50, there can still be some role for chemotherapy in addition to endocrine therapy especially if the recurrence score is between 21-25. If chemotherapy is needed, this will precede radiation and then after radiation she will continue on antiestrogen therapy. - Consider adj radiation. - Prescribe tamoxifen for 5-10 years post-radiation. We have discussed options for antiestrogen therapy today. With regards to Tamoxifen, we discussed that this is a SERM, selective estrogen receptor modulator. We discussed mechanism of action of Tamoxifen, adverse effects on Tamoxifen including but not limited to post menopausal symptoms, increased risk of DVT/PE, increased risk of endometrial cancer, questionable cataracts with long term use and increased risk of cardiovascular events in the study which was not statistically significant. A benefit from Tamoxifen would be improvement in bone density. - RTC in approximately 5/6 weeks to review pathology and oncotype results.

## 2024-04-08 ENCOUNTER — Encounter: Payer: Self-pay | Admitting: *Deleted

## 2024-04-11 ENCOUNTER — Other Ambulatory Visit: Payer: Self-pay

## 2024-04-11 ENCOUNTER — Ambulatory Visit
Admission: RE | Admit: 2024-04-11 | Discharge: 2024-04-11 | Disposition: A | Discharge status: Home / Self Care | Source: Ambulatory Visit | Attending: General Surgery | Admitting: General Surgery

## 2024-04-11 ENCOUNTER — Ambulatory Visit (HOSPITAL_BASED_OUTPATIENT_CLINIC_OR_DEPARTMENT_OTHER): Admitting: Anesthesiology

## 2024-04-11 ENCOUNTER — Ambulatory Visit (HOSPITAL_COMMUNITY)
Admission: RE | Admit: 2024-04-11 | Discharge: 2024-04-11 | Disposition: A | Discharge status: Home / Self Care | Source: Ambulatory Visit | Attending: General Surgery | Admitting: General Surgery

## 2024-04-11 ENCOUNTER — Other Ambulatory Visit: Payer: Self-pay | Admitting: General Surgery

## 2024-04-11 ENCOUNTER — Encounter (HOSPITAL_BASED_OUTPATIENT_CLINIC_OR_DEPARTMENT_OTHER): Payer: Self-pay | Admitting: General Surgery

## 2024-04-11 ENCOUNTER — Encounter (HOSPITAL_BASED_OUTPATIENT_CLINIC_OR_DEPARTMENT_OTHER)
Admission: RE | Disposition: A | Discharge status: Home / Self Care | Payer: Self-pay | Source: Home / Self Care | Attending: General Surgery

## 2024-04-11 ENCOUNTER — Ambulatory Visit (HOSPITAL_BASED_OUTPATIENT_CLINIC_OR_DEPARTMENT_OTHER)
Admission: RE | Admit: 2024-04-11 | Discharge: 2024-04-11 | Disposition: A | Discharge status: Home / Self Care | Attending: General Surgery | Admitting: General Surgery

## 2024-04-11 DIAGNOSIS — C773 Secondary and unspecified malignant neoplasm of axilla and upper limb lymph nodes: Secondary | ICD-10-CM | POA: Diagnosis not present

## 2024-04-11 DIAGNOSIS — K219 Gastro-esophageal reflux disease without esophagitis: Secondary | ICD-10-CM | POA: Insufficient documentation

## 2024-04-11 DIAGNOSIS — Z170 Estrogen receptor positive status [ER+]: Secondary | ICD-10-CM | POA: Diagnosis not present

## 2024-04-11 DIAGNOSIS — C50911 Malignant neoplasm of unspecified site of right female breast: Secondary | ICD-10-CM | POA: Insufficient documentation

## 2024-04-11 DIAGNOSIS — Z1721 Progesterone receptor positive status: Secondary | ICD-10-CM | POA: Diagnosis not present

## 2024-04-11 DIAGNOSIS — C50411 Malignant neoplasm of upper-outer quadrant of right female breast: Secondary | ICD-10-CM | POA: Diagnosis not present

## 2024-04-11 DIAGNOSIS — Z1732 Human epidermal growth factor receptor 2 negative status: Secondary | ICD-10-CM | POA: Insufficient documentation

## 2024-04-11 DIAGNOSIS — Z01818 Encounter for other preprocedural examination: Secondary | ICD-10-CM

## 2024-04-11 DIAGNOSIS — G8918 Other acute postprocedural pain: Secondary | ICD-10-CM | POA: Diagnosis not present

## 2024-04-11 DIAGNOSIS — D0511 Intraductal carcinoma in situ of right breast: Secondary | ICD-10-CM | POA: Diagnosis not present

## 2024-04-11 HISTORY — PX: BREAST BIOPSY: SHX20

## 2024-04-11 HISTORY — PX: BREAST LUMPECTOMY WITH RADIOACTIVE SEED AND SENTINEL LYMPH NODE BIOPSY: SHX6550

## 2024-04-11 HISTORY — DX: Anxiety disorder, unspecified: F41.9

## 2024-04-11 LAB — POCT PREGNANCY, URINE: Preg Test, Ur: NEGATIVE

## 2024-04-11 SURGERY — BREAST LUMPECTOMY WITH RADIOACTIVE SEED AND SENTINEL LYMPH NODE BIOPSY
Anesthesia: General | Site: Breast | Laterality: Right

## 2024-04-11 MED ORDER — ACETAMINOPHEN 500 MG PO TABS
ORAL_TABLET | ORAL | Status: AC
  Filled 2024-04-11: qty 2

## 2024-04-11 MED ORDER — BUPIVACAINE-EPINEPHRINE (PF) 0.5% -1:200000 IJ SOLN
INTRAMUSCULAR | Status: DC | PRN
  Administered 2024-04-11: 25 mL

## 2024-04-11 MED ORDER — FENTANYL CITRATE (PF) 100 MCG/2ML IJ SOLN
INTRAMUSCULAR | Status: DC | PRN
  Administered 2024-04-11: 100 ug via INTRAVENOUS

## 2024-04-11 MED ORDER — FENTANYL CITRATE (PF) 100 MCG/2ML IJ SOLN
50.0000 ug | Freq: Once | INTRAMUSCULAR | Status: AC
  Administered 2024-04-11: 50 ug via INTRAVENOUS

## 2024-04-11 MED ORDER — 0.9 % SODIUM CHLORIDE (POUR BTL) OPTIME
TOPICAL | Status: DC | PRN
  Administered 2024-04-11: 200 mL

## 2024-04-11 MED ORDER — MIDAZOLAM HCL 2 MG/2ML IJ SOLN
1.0000 mg | Freq: Once | INTRAMUSCULAR | Status: AC
  Administered 2024-04-11: 1 mg via INTRAVENOUS

## 2024-04-11 MED ORDER — GABAPENTIN 100 MG PO CAPS
ORAL_CAPSULE | ORAL | Status: AC
  Filled 2024-04-11: qty 1

## 2024-04-11 MED ORDER — OXYCODONE HCL 5 MG PO TABS: 5.0000 mg | ORAL_TABLET | Freq: Four times a day (QID) | ORAL | 0 refills | Status: DC | PRN

## 2024-04-11 MED ORDER — FENTANYL CITRATE (PF) 100 MCG/2ML IJ SOLN
INTRAMUSCULAR | Status: AC
  Filled 2024-04-11: qty 2

## 2024-04-11 MED ORDER — EPHEDRINE SULFATE (PRESSORS) 50 MG/ML IJ SOLN
INTRAMUSCULAR | Status: DC | PRN
  Administered 2024-04-11 (×2): 10 mg via INTRAVENOUS
  Administered 2024-04-11: 5 mg via INTRAVENOUS

## 2024-04-11 MED ORDER — OXYCODONE HCL 5 MG PO TABS
ORAL_TABLET | ORAL | Status: AC
Start: 2024-04-11 — End: 2024-04-11
  Filled 2024-04-11: qty 1

## 2024-04-11 MED ORDER — SCOPOLAMINE 1 MG/3DAYS TD PT72
MEDICATED_PATCH | TRANSDERMAL | Status: AC
  Filled 2024-04-11: qty 1

## 2024-04-11 MED ORDER — GABAPENTIN 100 MG PO CAPS
100.0000 mg | ORAL_CAPSULE | ORAL | Status: AC
  Administered 2024-04-11: 100 mg via ORAL

## 2024-04-11 MED ORDER — CEFAZOLIN SODIUM-DEXTROSE 2-4 GM/100ML-% IV SOLN
INTRAVENOUS | Status: AC
Start: 2024-04-11 — End: 2024-04-11
  Filled 2024-04-11: qty 100

## 2024-04-11 MED ORDER — ACETAMINOPHEN 500 MG PO TABS
1000.0000 mg | ORAL_TABLET | ORAL | Status: AC
  Administered 2024-04-11: 1000 mg via ORAL

## 2024-04-11 MED ORDER — CEFAZOLIN SODIUM-DEXTROSE 2-4 GM/100ML-% IV SOLN
2.0000 g | INTRAVENOUS | Status: AC
  Administered 2024-04-11: 2 g via INTRAVENOUS

## 2024-04-11 MED ORDER — ONDANSETRON HCL 4 MG/2ML IJ SOLN
INTRAMUSCULAR | Status: DC | PRN
  Administered 2024-04-11: 4 mg via INTRAVENOUS

## 2024-04-11 MED ORDER — MIDAZOLAM HCL 2 MG/2ML IJ SOLN
INTRAMUSCULAR | Status: AC
  Filled 2024-04-11: qty 2

## 2024-04-11 MED ORDER — TECHNETIUM TC 99M TILMANOCEPT KIT
1.0000 | PACK | Freq: Once | INTRAVENOUS | Status: AC | PRN
  Administered 2024-04-11: 1 via INTRADERMAL

## 2024-04-11 MED ORDER — CHLORHEXIDINE GLUCONATE CLOTH 2 % EX PADS: 6.0000 | MEDICATED_PAD | Freq: Once | CUTANEOUS | Status: DC

## 2024-04-11 MED ORDER — BUPIVACAINE-EPINEPHRINE 0.25% -1:200000 IJ SOLN
INTRAMUSCULAR | Status: DC | PRN
  Administered 2024-04-11: 20 mL

## 2024-04-11 MED ORDER — MIDAZOLAM HCL 2 MG/2ML IJ SOLN: 0.5000 mg | Freq: Once | INTRAMUSCULAR | Status: DC | PRN

## 2024-04-11 MED ORDER — OXYCODONE HCL 5 MG PO TABS
5.0000 mg | ORAL_TABLET | Freq: Once | ORAL | Status: AC | PRN
  Administered 2024-04-11: 5 mg via ORAL

## 2024-04-11 MED ORDER — LIDOCAINE HCL (CARDIAC) PF 100 MG/5ML IV SOSY
PREFILLED_SYRINGE | INTRAVENOUS | Status: DC | PRN
  Administered 2024-04-11: 20 mg via INTRAVENOUS

## 2024-04-11 MED ORDER — PROPOFOL 10 MG/ML IV BOLUS
INTRAVENOUS | Status: DC | PRN
  Administered 2024-04-11: 50 mg via INTRAVENOUS
  Administered 2024-04-11: 150 mg via INTRAVENOUS

## 2024-04-11 MED ORDER — DEXAMETHASONE SODIUM PHOSPHATE 4 MG/ML IJ SOLN
INTRAMUSCULAR | Status: DC | PRN
  Administered 2024-04-11: 4 mg via INTRAVENOUS

## 2024-04-11 MED ORDER — FENTANYL CITRATE (PF) 100 MCG/2ML IJ SOLN
INTRAMUSCULAR | Status: AC
Start: 2024-04-11 — End: 2024-04-11
  Filled 2024-04-11: qty 2

## 2024-04-11 MED ORDER — MEPERIDINE HCL 25 MG/ML IJ SOLN: 6.2500 mg | INTRAMUSCULAR | Status: DC | PRN

## 2024-04-11 MED ORDER — OXYCODONE HCL 5 MG/5ML PO SOLN: 5.0000 mg | Freq: Once | ORAL | Status: AC | PRN

## 2024-04-11 MED ORDER — HYDROMORPHONE HCL 1 MG/ML IJ SOLN: 0.2500 mg | INTRAMUSCULAR | Status: DC | PRN

## 2024-04-11 MED ORDER — LACTATED RINGERS IV SOLN: INTRAVENOUS | Status: DC

## 2024-04-11 MED ORDER — SCOPOLAMINE 1 MG/3DAYS TD PT72
1.0000 | MEDICATED_PATCH | TRANSDERMAL | Status: DC
  Administered 2024-04-11: 1.5 mg via TRANSDERMAL

## 2024-04-11 SURGICAL SUPPLY — 33 items
BLADE SURG 15 STRL LF DISP TIS (BLADE) ×1 IMPLANT
CANISTER SUC SOCK COL 7IN (MISCELLANEOUS) IMPLANT
CANISTER SUCT 1200ML W/VALVE (MISCELLANEOUS) IMPLANT
CHLORAPREP W/TINT 26 (MISCELLANEOUS) ×1 IMPLANT
CLIP APPLIE 9.375 MED OPEN (MISCELLANEOUS) ×1 IMPLANT
COVER BACK TABLE 60X90IN (DRAPES) ×1 IMPLANT
COVER MAYO STAND STRL (DRAPES) ×1 IMPLANT
COVER PROBE CYLINDRICAL 5X96 (MISCELLANEOUS) ×1 IMPLANT
DERMABOND ADVANCED .7 DNX12 (GAUZE/BANDAGES/DRESSINGS) ×1 IMPLANT
DRAPE LAPAROSCOPIC ABDOMINAL (DRAPES) ×1 IMPLANT
DRAPE UTILITY XL STRL (DRAPES) ×1 IMPLANT
ELECT COATED BLADE 2.86 ST (ELECTRODE) ×1 IMPLANT
ELECTRODE REM PT RTRN 9FT ADLT (ELECTROSURGICAL) ×1 IMPLANT
GLOVE BIO SURGEON STRL SZ7.5 (GLOVE) ×1 IMPLANT
GOWN STRL REUS W/ TWL LRG LVL3 (GOWN DISPOSABLE) ×2 IMPLANT
KIT MARKER MARGIN INK (KITS) ×1 IMPLANT
NDL HYPO 25X1 1.5 SAFETY (NEEDLE) ×1 IMPLANT
NDL SAFETY ECLIPSE 18X1.5 (NEEDLE) IMPLANT
NEEDLE HYPO 25X1 1.5 SAFETY (NEEDLE) ×1 IMPLANT
NS IRRIG 1000ML POUR BTL (IV SOLUTION) IMPLANT
PACK BASIN DAY SURGERY FS (CUSTOM PROCEDURE TRAY) ×1 IMPLANT
PENCIL SMOKE EVACUATOR (MISCELLANEOUS) ×1 IMPLANT
SLEEVE SCD COMPRESS KNEE MED (STOCKING) ×1 IMPLANT
SPIKE FLUID TRANSFER (MISCELLANEOUS) IMPLANT
SPONGE T-LAP 18X18 ~~LOC~~+RFID (SPONGE) ×1 IMPLANT
SUT MON AB 4-0 PC3 18 (SUTURE) ×2 IMPLANT
SUT SILK 2 0 SH (SUTURE) IMPLANT
SUT VICRYL 3-0 CR8 SH (SUTURE) ×1 IMPLANT
SYR CONTROL 10ML LL (SYRINGE) ×1 IMPLANT
TOWEL GREEN STERILE FF (TOWEL DISPOSABLE) ×1 IMPLANT
TRAY FAXITRON CT DISP (TRAY / TRAY PROCEDURE) ×1 IMPLANT
TUBE CONNECTING 20X1/4 (TUBING) IMPLANT
YANKAUER SUCT BULB TIP NO VENT (SUCTIONS) IMPLANT

## 2024-04-11 NOTE — Anesthesia Procedure Notes (Signed)
 Procedure Name: LMA Insertion Date/Time: 04/11/2024 11:28 AM  Performed by: Eugenia Hess, CRNAPre-anesthesia Checklist: Patient identified, Emergency Drugs available, Suction available and Patient being monitored Patient Re-evaluated:Patient Re-evaluated prior to induction Oxygen Delivery Method: Circle System Utilized Preoxygenation: Pre-oxygenation with 100% oxygen Induction Type: IV induction Ventilation: Mask ventilation without difficulty LMA: LMA inserted LMA Size: 3.0 Number of attempts: 1 Airway Equipment and Method: bite block Placement Confirmation: positive ETCO2 Tube secured with: Tape Dental Injury: Teeth and Oropharynx as per pre-operative assessment

## 2024-04-11 NOTE — Anesthesia Postprocedure Evaluation (Signed)
 Anesthesia Post Note  Patient: Elleanor Guyett  Procedure(s) Performed: BREAST LUMPECTOMY WITH RADIOACTIVE SEED AND SENTINEL LYMPH NODE BIOPSY (Right: Breast)     Patient location during evaluation: PACU Anesthesia Type: General Level of consciousness: awake and alert, patient cooperative and oriented Pain management: pain level controlled Vital Signs Assessment: post-procedure vital signs reviewed and stable Respiratory status: spontaneous breathing, nonlabored ventilation and respiratory function stable Cardiovascular status: blood pressure returned to baseline and stable Postop Assessment: no apparent nausea or vomiting Anesthetic complications: no  No notable events documented.  Last Vitals:  Vitals:   04/11/24 1249 04/11/24 1300  BP: 119/69 129/68  Pulse: 78 88  Resp: 12 15  Temp: 36.9 C   SpO2: 99% 99%    Last Pain:  Vitals:   04/11/24 1249  PainSc: 0-No pain                 Shaketta Rill,E. Yaneisy Wenz

## 2024-04-11 NOTE — Anesthesia Procedure Notes (Signed)
 Anesthesia Regional Block: Pectoralis block   Pre-Anesthetic Checklist: , timeout performed,  Correct Patient, Correct Site, Correct Laterality,  Correct Procedure, Correct Position, site marked,  Risks and benefits discussed,  Surgical consent,  Pre-op evaluation,  At surgeon's request and post-op pain management  Laterality: Right and Upper  Prep: chloraprep       Needles:  Injection technique: Single-shot  Needle Type: Echogenic Needle     Needle Length: 9cm  Needle Gauge: 21     Additional Needles:   Procedures:,,,, ultrasound used (permanent image in chart),,    Narrative:  Start time: 04/11/2024 10:38 AM End time: 04/11/2024 10:44 AM Injection made incrementally with aspirations every 5 mL.  Performed by: Personally  Anesthesiologist: Jonne Netters, MD  Additional Notes: Pt identified in Holding room.  Monitors applied. Working IV access confirmed. Timeout, Sterile prep R clavicle and pec.  #21ga ECHOgenic Arrow block needle between pec minor and pec major, then pec minor and intercostal, ribs 3,4 with US  guidance.  Total 25cc 0.5% Bupivacaine  1:200k epi injected incrementally after negative test dose.  Patient asymptomatic, VSS, no heme aspirated, tolerated well.   Fay Hoop, MD

## 2024-04-11 NOTE — Op Note (Signed)
 04/11/2024  12:37 PM  PATIENT:  Tonya Summers  48 y.o. female  PRE-OPERATIVE DIAGNOSIS:  RIGHT BREAST CANCER  POST-OPERATIVE DIAGNOSIS:  RIGHT BREAST CANCER  PROCEDURE:  Procedure(s) with comments: RIGHT BREAST RADIOACTIVE SEED LOCALIZED LUMPECTOMY x2 AND DEEP RIGHT AXILLARY SENTINEL LYMPH NODE BIOPSY  SURGEON:  Surgeons and Role:    * Caralyn Chandler, MD - Primary  PHYSICIAN ASSISTANT:   ASSISTANTS: none   ANESTHESIA:   local and general  EBL:  5 mL   BLOOD ADMINISTERED:none  DRAINS: none   LOCAL MEDICATIONS USED:  MARCAINE      SPECIMEN:  Source of Specimen:  right breast tissue and sentinel nodes x 2  DISPOSITION OF SPECIMEN:  PATHOLOGY  COUNTS:  YES  TOURNIQUET:  * No tourniquets in log *  DICTATION: .Dragon Dictation  After informed consent was obtained the patient was brought to the operating room and placed in the supine position on the operating table.  After adequate induction of general anesthesia the patient's right chest, breast, and axillary area were prepped with ChloraPrep, allowed to dry, and draped in usual sterile manner.  An appropriate timeout was performed.  Previously 2 I-125 seeds were placed in the lateral aspect of the right breast to mark areas of invasive breast cancer and DCIS.  Also earlier in the day the patient underwent injection of 1 mCi of technetium sulfur  colloid in the subareolar position on the right.  Attention was first turned to the right axilla.  The neoprobe was set to technetium and an area of radioactivity was readily identified.  The area overlying this was infiltrated with quarter percent Marcaine .  A small transversely oriented incision was made with a 15 blade knife overlying the area of radioactivity.  The incision was carried through the skin and subcutaneous tissue sharply with the electrocautery until the deep right axillary space was entered.  The neoprobe was used to direct blunt hemostat dissection.  I was able to identify 2  lymph nodes with signal.  Each of these was excised sharply with the electrocautery and the surrounding small vessels and lymphatics were controlled with clips.  Ex vivo counts on these nodes ranged from [phone].  No other hot or palpable nodes were identified in the right axilla.  Hemostasis was achieved using the Bovie electrocautery.  The deep layer of the incision was then closed with interrupted 3-0 Vicryl stitches.  The skin was closed with a running 4-0 Monocryl subcuticular stitch.  Attention was then turned to the right breast.  The neoprobe was set to I-125 and both areas of radioactivity were readily identified.  Because of the small breast size and the superficial nature of the more lateral seed I elected to make an elliptical incision in the skin overlying the 2 seeds with a 15 blade knife.  The incision was carried through the skin and subcutaneous tissue sharply with the electrocautery.  Dissection was then carried around the 2 radioactive seeds while checking the area of radioactivity frequently.  This came out as 1 specimen.  Once the tissue was removed it was oriented with the appropriate paint colors.  A specimen radiograph was obtained that showed the 2 clips and 2 seeds to be well within the specimen.  The specimen was then sent to pathology for further evaluation.  Hemostasis was achieved using the Bovie electrocautery.  The wound was irrigated with saline and infiltrated with more quarter percent Marcaine .  The cavity was marked with clips.  The incision was then closed  with interrupted 3-0 Vicryl stitches.  The skin was closed with interrupted 4-0 Monocryl subcuticular stitches.  Dermabond dressings were applied.  The patient tolerated the procedure well.  At the end of the case all needle sponge and instrument counts were correct.  The patient was then awakened and taken to recovery in stable condition. PLAN OF CARE: Discharge to home after PACU  PATIENT DISPOSITION:  PACU -  hemodynamically stable.   Delay start of Pharmacological VTE agent (>24hrs) due to surgical blood loss or risk of bleeding: not applicable

## 2024-04-11 NOTE — H&P (Signed)
 REFERRING PHYSICIAN: Tita Form, MD PROVIDER: Arlester Bence, MD MRN: [id number] DOB: 1976/09/09 Subjective    Chief Complaint: NEW BREAST CANCER  History of Present Illness: Tonya Summers is a 48 y.o. female who is seen today as an office consultation for evaluation of NEW BREAST CANCER  We are asked to see the patient in consultation by Dr. Ariel Begun to evaluate her for a new right breast cancer. The patient is a 48 year old female who recently went for a routine screening mammogram. At that time she was found to have a 9 mm area of asymmetry and calcification in the lateral aspect of the right breast as well as a 1.4 cm mass in the lateral subareolar right breast. The axilla looked normal. The 2 areas were biopsied. The smaller more lateral mass came back as a grade 2 invasive ductal cancer that was ER/PR positive and HER2 negative with a Ki-67 of 10%. The more central area came back as ductal carcinoma in situ. She has no family history of breast cancer. She does not smoke. She did have an area of atypical ductal hyperplasia removed from the right breast back in 2017.  Review of Systems: A complete review of systems was obtained from the patient. I have reviewed this information and discussed as appropriate with the patient. See HPI as well for other ROS.  ROS   Medical History: History reviewed. No pertinent past medical history.  Patient Active Problem List  Diagnosis  Malignant neoplasm of upper-outer quadrant of right breast in female, estrogen receptor positive (CMS/HHS-HCC)   Past Surgical History:  Procedure Laterality Date  CHOLECYSTECTOMY    No Known Allergies  No current outpatient medications on file prior to visit.   No current facility-administered medications on file prior to visit.   History reviewed. No pertinent family history.   Social History   Tobacco Use  Smoking Status Never  Smokeless Tobacco Never    Social History   Socioeconomic History   Marital status: Married  Tobacco Use  Smoking status: Never  Smokeless tobacco: Never  Substance and Sexual Activity  Alcohol use: Never  Drug use: Never   Objective:   Vitals:  BP: 132/88  Pulse: 65  Temp: 36.8 C (98.3 F)  SpO2: 99%  Weight: 53.8 kg (118 lb 9.6 oz)  Height: 170.2 cm (5' 7)  PainSc: 0-No pain  PainLoc: Breast   Body mass index is 18.58 kg/m.  Physical Exam Vitals reviewed.  Constitutional:  General: She is not in acute distress. Appearance: Normal appearance.  HENT:  Head: Normocephalic and atraumatic.  Right Ear: External ear normal.  Left Ear: External ear normal.  Nose: Nose normal.  Mouth/Throat:  Mouth: Mucous membranes are moist.  Pharynx: Oropharynx is clear.  Eyes:  General: No scleral icterus. Extraocular Movements: Extraocular movements intact.  Conjunctiva/sclera: Conjunctivae normal.  Pupils: Pupils are equal, round, and reactive to light.  Cardiovascular:  Rate and Rhythm: Normal rate and regular rhythm.  Pulses: Normal pulses.  Heart sounds: Murmur heard.  Pulmonary:  Effort: Pulmonary effort is normal. No respiratory distress.  Breath sounds: Normal breath sounds.  Abdominal:  General: Bowel sounds are normal.  Palpations: Abdomen is soft.  Tenderness: There is no abdominal tenderness.  Musculoskeletal:  General: No swelling, tenderness or deformity. Normal range of motion.  Cervical back: Normal range of motion and neck supple.  Skin: General: Skin is warm and dry.  Coloration: Skin is not jaundiced.  Neurological:  General: No focal deficit present.  Mental Status:  She is alert and oriented to person, place, and time.  Psychiatric:  Mood and Affect: Mood normal.  Behavior: Behavior normal.     Breast: There is no palpable mass in either breast. There is no palpable axillary, supraclavicular, or cervical lymphadenopathy.  Labs, Imaging and Diagnostic Testing:  Assessment and Plan:   Diagnoses and all orders  for this visit:  Malignant neoplasm of upper-outer quadrant of right breast in female, estrogen receptor positive (CMS/HHS-HCC) - [facility] Case Posting Request; Future   The patient appears to have 2 areas of cancer in the lateral aspect of the right breast with clinically normal nodes and all favorable markers. I have discussed with her in detail the different options for treatment and at this point she favors breast conservation which I feel is very reasonable. She would be a good candidate for sentinel node mapping as well. Given the breast density and the fact that 2 areas were seen in the breast my recommendation was for MRI to make sure that these were 2 discrete spots and not part of the larger process. The patient and her husband declined the MRI. I have discussed with her in detail the risks and benefits of the operation as well as some of the technical aspects including the use of a radioactive seed for localization and she understands and wishes to proceed. We will move forward with surgical scheduling. She has appointments next weeks with medical and radiation oncology to discuss adjuvant therapy.

## 2024-04-11 NOTE — Anesthesia Preprocedure Evaluation (Addendum)
 Anesthesia Evaluation  Patient identified by MRN, date of birth, ID band Patient awake    Reviewed: Allergy & Precautions, NPO status , Patient's Chart, lab work & pertinent test results  History of Anesthesia Complications Negative for: history of anesthetic complications  Airway Mallampati: II  TM Distance: >3 FB Neck ROM: Full    Dental  (+) Dental Advisory Given   Pulmonary neg pulmonary ROS   breath sounds clear to auscultation       Cardiovascular negative cardio ROS  Rhythm:Regular Rate:Normal     Neuro/Psych   Anxiety Depression    negative neurological ROS     GI/Hepatic Neg liver ROS,GERD  Controlled,,  Endo/Other  negative endocrine ROS    Renal/GU negative Renal ROS     Musculoskeletal   Abdominal   Peds  Hematology   Anesthesia Other Findings Breast cancer  Reproductive/Obstetrics                             Anesthesia Physical Anesthesia Plan  ASA: 2  Anesthesia Plan: General   Post-op Pain Management: Regional block* and Tylenol  PO (pre-op)*   Induction: Intravenous  PONV Risk Score and Plan: 3 and Ondansetron , Dexamethasone  and Scopolamine  patch - Pre-op  Airway Management Planned: LMA  Additional Equipment: None  Intra-op Plan:   Post-operative Plan:   Informed Consent: I have reviewed the patients History and Physical, chart, labs and discussed the procedure including the risks, benefits and alternatives for the proposed anesthesia with the patient or authorized representative who has indicated his/her understanding and acceptance.     Dental advisory given  Plan Discussed with: CRNA and Surgeon  Anesthesia Plan Comments: (Plan routine monitors, GA with pectoralis block for post op analgesia)       Anesthesia Quick Evaluation

## 2024-04-11 NOTE — Discharge Instructions (Addendum)
 May have Tylenol  today after 3:32 PM

## 2024-04-11 NOTE — Transfer of Care (Signed)
 Immediate Anesthesia Transfer of Care Note  Patient: Tonya Summers  Procedure(s) Performed: BREAST LUMPECTOMY WITH RADIOACTIVE SEED AND SENTINEL LYMPH NODE BIOPSY (Right: Breast)  Patient Location: PACU  Anesthesia Type:GA combined with regional for post-op pain  Level of Consciousness: awake, alert , oriented, drowsy, and patient cooperative  Airway & Oxygen Therapy: Patient Spontanous Breathing and Patient connected to face mask oxygen  Post-op Assessment: Report given to RN and Post -op Vital signs reviewed and stable  Post vital signs: Reviewed and stable  Last Vitals:  Vitals Value Taken Time  BP    Temp    Pulse 78 04/11/24 12:49  Resp 12 04/11/24 12:49  SpO2 99 % 04/11/24 12:49  Vitals shown include unfiled device data.  Last Pain:  Vitals:   04/11/24 0932  PainSc: 0-No pain         Complications: No notable events documented.

## 2024-04-11 NOTE — Progress Notes (Signed)
Assisted Dr. Annye Asa with right, pectoralis, ultrasound guided block. Side rails up, monitors on throughout procedure. See vital signs in flow sheet. Tolerated Procedure well. ?

## 2024-04-11 NOTE — Interval H&P Note (Signed)
 History and Physical Interval Note:  04/11/2024 10:52 AM  Tonya Summers  has presented today for surgery, with the diagnosis of RIGHT BREAST CANCER.  The various methods of treatment have been discussed with the patient and family. After consideration of risks, benefits and other options for treatment, the patient has consented to  Procedure(s) with comments: BREAST LUMPECTOMY WITH RADIOACTIVE SEED AND SENTINEL LYMPH NODE BIOPSY (Right) - RIGHT BREAST RADIOACTIVE SEED LOCALIZED LUMPECTOMY x2 SENTINEL NODE BIOPSY as a surgical intervention.  The patient's history has been reviewed, patient examined, no change in status, stable for surgery.  I have reviewed the patient's chart and labs.  Questions were answered to the patient's satisfaction.     Lillette Reid III

## 2024-04-12 ENCOUNTER — Encounter (HOSPITAL_BASED_OUTPATIENT_CLINIC_OR_DEPARTMENT_OTHER): Payer: Self-pay | Admitting: General Surgery

## 2024-04-14 ENCOUNTER — Ambulatory Visit: Payer: Self-pay | Admitting: General Surgery

## 2024-04-14 ENCOUNTER — Telehealth: Payer: Self-pay | Admitting: *Deleted

## 2024-04-14 ENCOUNTER — Encounter: Payer: Self-pay | Admitting: *Deleted

## 2024-04-14 LAB — SURGICAL PATHOLOGY

## 2024-04-14 NOTE — Telephone Encounter (Signed)
Received order for Oncotype Testing per Dr. Chryl Heck. Requisition faxed to pathology and [facility]

## 2024-04-18 ENCOUNTER — Encounter: Payer: Self-pay | Admitting: *Deleted

## 2024-04-18 NOTE — Progress Notes (Signed)
 Oncotype ordered on this patient 6/20, sent to pathology to send out

## 2024-04-22 ENCOUNTER — Telehealth: Payer: Self-pay | Admitting: Genetic Counselor

## 2024-04-22 NOTE — Telephone Encounter (Signed)
 Called to schedule genetics appt. Pt was unavailable. Asked for callback at a later date.

## 2024-04-23 DIAGNOSIS — C50411 Malignant neoplasm of upper-outer quadrant of right female breast: Secondary | ICD-10-CM | POA: Diagnosis not present

## 2024-04-23 DIAGNOSIS — Z170 Estrogen receptor positive status [ER+]: Secondary | ICD-10-CM | POA: Diagnosis not present

## 2024-04-24 ENCOUNTER — Encounter (HOSPITAL_COMMUNITY): Payer: Self-pay

## 2024-04-25 ENCOUNTER — Telehealth: Payer: Self-pay | Admitting: *Deleted

## 2024-04-25 ENCOUNTER — Telehealth: Payer: Self-pay

## 2024-04-25 NOTE — Telephone Encounter (Signed)
 Husband verbally confirmed appt for 6/30

## 2024-04-25 NOTE — Telephone Encounter (Signed)
 This RN spoke with pt's husband per  so now what is next step   This RN informed him MD is out of the office at present with plan for pt to come in Monday to discuss Oncotype and surgical results for further recommendations.

## 2024-04-28 ENCOUNTER — Telehealth: Payer: Self-pay | Admitting: *Deleted

## 2024-04-28 ENCOUNTER — Encounter: Payer: Self-pay | Admitting: *Deleted

## 2024-04-28 ENCOUNTER — Inpatient Hospital Stay (HOSPITAL_BASED_OUTPATIENT_CLINIC_OR_DEPARTMENT_OTHER): Admitting: Hematology and Oncology

## 2024-04-28 VITALS — BP 148/63 | HR 91 | Temp 98.1°F | Resp 17 | Wt 123.9 lb

## 2024-04-28 DIAGNOSIS — Z1721 Progesterone receptor positive status: Secondary | ICD-10-CM | POA: Diagnosis not present

## 2024-04-28 DIAGNOSIS — C50411 Malignant neoplasm of upper-outer quadrant of right female breast: Secondary | ICD-10-CM | POA: Diagnosis not present

## 2024-04-28 DIAGNOSIS — Z170 Estrogen receptor positive status [ER+]: Secondary | ICD-10-CM | POA: Insufficient documentation

## 2024-04-28 DIAGNOSIS — Z1732 Human epidermal growth factor receptor 2 negative status: Secondary | ICD-10-CM | POA: Diagnosis not present

## 2024-04-28 NOTE — Assessment & Plan Note (Signed)
 Assessment and Plan Assessment & Plan Invasive ductal carcinoma of right breast Invasive ductal carcinoma, 12 mm, grade 2, stage 1, ER/PR-positive, HER2-negative, KI-67 10%.  Final pathology with 6 mm tumor with clear surgical margins and lymph nodes. Oncotype score of 9 indicates low recurrence risk. No chemotherapy needed. Radiation therapy required, followed by hormone therapy. Distant recurrence risk post-therapy approximately 3%. - Coordinate radiation therapy with Dr. Izell. - Discuss travel plans with radiation oncologist to determine timing of radiation therapy. - Plan for radiation therapy to last approximately four weeks. - Initiate tamoxifen after completion of radiation therapy. - Schedule follow-up appointment for end of September to start tamoxifen.

## 2024-04-28 NOTE — Telephone Encounter (Signed)
 Received oncotype results of 9/3%. Appt with Dr. Loretha 6/30.

## 2024-04-28 NOTE — Progress Notes (Signed)
 [facility] CONSULT NOTE  Patient Care Team: [name], MD as PCP - General (Internal Medicine) Debrah Lamar BIRCH, MD (Inactive) as Consulting Physician (Gastroenterology) Rogelio Planas, MD as Consulting Physician (Obstetrics and Gynecology) Glean Stephane BROCKS, RN (Inactive) as Oncology Nurse Navigator Tyree Nanetta SAILOR, RN as Oncology Nurse Navigator [name], MD as Consulting Physician (Hematology and Oncology)  CHIEF COMPLAINTS/PURPOSE OF CONSULTATION:  Newly diagnosed breast cancer  HISTORY OF PRESENTING ILLNESS:  Tonya Summers 48 y.o. female is here because of recent diagnosis of right breast cancer  I reviewed her records extensively and collaborated the history with the patient.  SUMMARY OF ONCOLOGIC HISTORY: Oncology History  Malignant neoplasm of upper-outer quadrant of right female breast (HCC)  03/27/2024 Mammogram   New RIGHT breast mass measuring 12 x 6 x 14 mm, with associated loosely grouped round calcifications at the 9 o'clock retroareolar position in the RIGHT breast requires further characterization with ultrasound-guided biopsy.   2. RIGHT breast mass measuring 7 x 4 x 9 mm at the site of focal pain at 9 o'clock 5 cm from the nipple in the RIGHT breast is indeterminate and requires further characterization with ultrasound-guided biopsy.   3. No mammographic evidence of malignancy in the LEFT breast. No suspicious RIGHT axillary lymph nodes.   03/28/2024 Pathology Results   1. Breast, right, needle core biopsy, 9:00, 5 cmfn, coil :      INVASIVE MODERATELY DIFFERENTIATED DUCTAL ADENOCARCINOMA WITH EXTRACELLULAR      MUCIN, GRADE 2 (3+2+1)      DUCTAL CARCINOMA IN SITU, INTERMEDIATE NUCLEAR GRADE, CRIBRIFORM AND PAPILLARY      TYPES      TUBULE FORMATION: SCORE 3      NUCLEAR PLEOMORPHISM: SCORE 2      MITOTIC COUNT: SCORE 1      TOTAL SCORE: 6      OVERALL GRADE: GRADE 2 (6/9)      NEGATIVE FOR ANGIOLYMPHATIC INVASION       NEGATIVE FOR MICROCALCIFICATIONS      TUMOR MEASURES 10 MM IN GREATEST LINEAR EXTENT       2. Breast, right, needle core biopsy, 9:00, retroareolar, heart clip :      DUCTAL CARCINOMA IN SITU, INTERMEDIATE TO HIGH NUCLEAR GRADE, WITH PAPILLARY      FEATURES      NEGATIVE FOR INVASIVE CARCINOMA      NEGATIVE FOR MICROCALCIFICATIONS      TUMOR MEASURES 5 MM IN GREATEST LINEAR EXTENT   Estrogen Receptor:  100%, POSITIVE, STRONG STAINING INTENSITY  Progesterone  Receptor:  30%, POSITIVE, MODERATE-STRONG STAINING INTENSITY  Proliferation Marker Ki67:  10%  The tumor cells are NEGATIVE for Her2 (1+).    03/31/2024 Initial Diagnosis   Malignant neoplasm of upper-outer quadrant of right female breast [facility])    Discussed the use of AI scribe software for clinical note transcription with the patient, who gave verbal consent to proceed.  History of Present Illness  Tonya Summers is a 48 year old female with breast cancer who presents for follow-up after surgery.  She recently underwent breast cancer surgery, which revealed a small tumor measuring six millimeters with clear margins and no lymph node involvement. Oncotype with no role for adj chemotherapy.  She is considering travel plans in relation to her son's summer break, which ends in August. Her son, who is 33 years old, recently graduated and will be attending university for engineering.  She is originally from [place] and plans to visit her family in  [place], including her mother and father.   MEDICAL HISTORY:  Past Medical History:  Diagnosis Date   Anxiety    Asthma    GERD (gastroesophageal reflux disease)     SURGICAL HISTORY: Past Surgical History:  Procedure Laterality Date   BREAST BIOPSY Right 07/2015   BREAST BIOPSY Right 03/28/2024   US  RT BREAST BX W LOC DEV EA ADD LESION IMG BX SPEC US  GUIDE 03/28/2024 [facility]MO[facility]   BREAST BIOPSY Right 03/28/2024   US  RT BREAST BX W LOC DEV 1ST LESION IMG BX SPEC US  GUIDE  03/28/2024 [facility]   BREAST BIOPSY  04/11/2024   US  RT RADIOACTIVE SEED LOC 04/11/2024 [facility]   BREAST BIOPSY Right 04/11/2024   US  RT RADIOACTIVE SEED EA ADD LESION 04/11/2024 [facility] MAMMO[facility]   BREAST EXCISIONAL BIOPSY Right 10/2015   BREAST LUMPECTOMY WITH RADIOACTIVE SEED AND SENTINEL LYMPH NODE BIOPSY Right 04/11/2024   Procedure: BREAST LUMPECTOMY WITH RADIOACTIVE SEED AND SENTINEL LYMPH NODE BIOPSY;  Surgeon: [name] Deward MOULD, MD;  Location: [facility];  Service: General;  Laterality: Right;  RIGHT BREAST RADIOACTIVE SEED LOCALIZED LUMPECTOMY x2 SENTINEL NODE BIOPSY   CHOLECYSTECTOMY     RADIOACTIVE SEED GUIDED EXCISIONAL BREAST BIOPSY Right 11/25/2015   Procedure: RADIOACTIVE SEED GUIDED EXCISIONAL BREAST BIOPSY;  Surgeon: Jina Nephew, MD;  Location: [facility];  Service: General;  Laterality: Right;    SOCIAL HISTORY: Social History   Socioeconomic History   Marital status: Married    Spouse name: Mohammed   Number of children: 1   Years of education: Not on file   Highest education level: Not on file  Occupational History   Not on file  Tobacco Use   Smoking status: Never   Smokeless tobacco: Never  Vaping Use   Vaping status: Never Used  Substance and Sexual Activity   Alcohol use: No    Alcohol/week: 0.0 standard drinks of alcohol   Drug use: No   Sexual activity: Not Currently    Partners: Male    Birth control/protection: None  Other Topics Concern   Not on file  Social History Narrative   Marital status: married      Children: 1 child (9)      Moved from [place] in 2016   Social Drivers of Corporate investment banker Strain: Not on file  Food Insecurity: No Food Insecurity (04/07/2024)   Hunger Vital Sign    Worried About Radiation protection practitioner of Food in the Last Year: Never true    Ran Out of Food in the Last Year: Never true  Transportation Needs: No Transportation Needs (04/07/2024)   PRAPARE - Therapist, art (Medical): No    Lack of Transportation (Non-Medical): No  Physical Activity: Not on file  Stress: Not on file  Social Connections: Not on file  Intimate Partner Violence: Not At Risk (04/07/2024)   Humiliation, Afraid, Rape, and Kick questionnaire    Fear of Current or Ex-Partner: No    Emotionally Abused: No    Physically Abused: No    Sexually Abused: No    FAMILY HISTORY: Family History  Problem Relation Age of Onset   Colon cancer Neg Hx    Stomach cancer Neg Hx    Pancreatic cancer Neg Hx    Esophageal cancer Neg Hx    Rectal cancer Neg Hx     ALLERGIES:  is allergic to no known allergies.  MEDICATIONS:  Current Outpatient  Medications  Medication Sig Dispense Refill   oxyCODONE  (ROXICODONE ) 5 MG immediate release tablet Take 1 tablet (5 mg total) by mouth every 6 (six) hours as needed. 15 tablet 0   No current facility-administered medications for this visit.     All other systems were reviewed with the patient and are negative.  PHYSICAL EXAMINATION: ECOG PERFORMANCE STATUS: 0 - Asymptomatic  Vitals:   04/28/24 1208 04/28/24 1209  BP: (!) 159/55 (!) 148/63  Pulse: 91   Resp: 17   Temp: 98.1 F (36.7 C)   SpO2: 100%    Filed Weights   04/28/24 1208  Weight: 123 lb 14.4 oz (56.2 kg)    GENERAL:alert, no distress and comfortable  LABORATORY DATA:  I have reviewed the data as listed Lab Results  Component Value Date   WBC 2.6 (L) 11/29/2023   HGB 12.1 11/29/2023   HCT 36.5 11/29/2023   MCV 90 11/29/2023   PLT 128 (L) 11/29/2023   Lab Results  Component Value Date   NA CANCELED 11/29/2023   K CANCELED 11/29/2023   CL CANCELED 11/29/2023   CO2 20 11/29/2023    RADIOGRAPHIC STUDIES: I have personally reviewed the radiological reports and agreed with the findings in the report.  ASSESSMENT AND PLAN:  Malignant neoplasm of upper-outer quadrant of right female breast (HCC) Assessment and Plan Assessment & Plan Invasive  ductal carcinoma of right breast Invasive ductal carcinoma, 12 mm, grade 2, stage 1, ER/PR-positive, HER2-negative, KI-67 10%.  Final pathology with 6 mm tumor with clear surgical margins and lymph nodes. Oncotype score of 9 indicates low recurrence risk. No chemotherapy needed. Radiation therapy required, followed by hormone therapy. Distant recurrence risk post-therapy approximately 3%. - Coordinate radiation therapy with Dr. Izell. - Discuss travel plans with radiation oncologist to determine timing of radiation therapy. - Plan for radiation therapy to last approximately four weeks. - Initiate tamoxifen after completion of radiation therapy. - Schedule follow-up appointment for end of September to start tamoxifen.      All questions were answered. The patient knows to call the clinic with any problems, questions or concerns.    Amber Stalls, MD 04/28/24

## 2024-04-29 ENCOUNTER — Encounter: Payer: Self-pay | Admitting: Radiology

## 2024-04-29 ENCOUNTER — Ambulatory Visit
Admission: RE | Admit: 2024-04-29 | Discharge: 2024-04-29 | Disposition: A | Discharge status: Home / Self Care | Source: Ambulatory Visit | Attending: Radiation Oncology | Admitting: Radiation Oncology

## 2024-04-29 ENCOUNTER — Ambulatory Visit
Admission: RE | Admit: 2024-04-29 | Discharge: 2024-04-29 | Disposition: A | Discharge status: Home / Self Care | Source: Ambulatory Visit | Attending: Radiology | Admitting: Radiology

## 2024-04-29 VITALS — BP 143/56 | HR 87 | Temp 97.9°F | Resp 18 | Ht 67.0 in | Wt 123.4 lb

## 2024-04-29 DIAGNOSIS — Z170 Estrogen receptor positive status [ER+]: Secondary | ICD-10-CM | POA: Diagnosis not present

## 2024-04-29 DIAGNOSIS — C50411 Malignant neoplasm of upper-outer quadrant of right female breast: Secondary | ICD-10-CM

## 2024-04-29 LAB — PREGNANCY, URINE: Preg Test, Ur: NEGATIVE

## 2024-04-29 NOTE — Progress Notes (Signed)
 Radiation Oncology         [phone] ________________________________  Name: Tonya Summers MRN: [id number]  Date: 04/29/2024  DOB: November 02, 1975  Re-Evaluation Note  CC: [name], MD  [name], MD    ICD-10-CM   1. Malignant neoplasm of upper-outer quadrant of right breast in female, estrogen receptor positive (HCC)  C50.411    Z17.0         Cancer Staging  Malignant neoplasm of upper-outer quadrant of right female breast [name]) Staging form: Breast, AJCC 8th Edition - Pathologic: Stage IA (pT1b, pN0, cM0, G2, ER+, PR+, HER2-) - Signed by [name], [name] on 04/29/2024 Multigene prognostic tests performed: Oncotype DX Histologic grading system: 3 grade system   Diagnosis:   Stage IA  (pT1b, N0, M0) Intermediate grade Invasive Ductal Carcinoma of the Right Breast, ER+ / PR+ / Her2-  Narrative:  The patient returns today to discuss radiation treatment options. She was seen in consultation on 04/07/2024.   She opted to proceed with right lumpectomy with nodal biopsy on  04/11/2024. Pathology from the procedure revealed: intermediate grade invasive ductal carcinoma, measuring 0.6 cm in greatest dimension. The margins were negative with the closes invasive 1.5 mm from the anterior margin and closest DCIS 0.5 mm from the medial margin. Prognostic markers were ER/PR positive, HER2 negative, and Ki-67 10%. 2/2 lymph nodes were negative for carcinoma.   Patient was seen by Dr. Loretha on 04/28/2024 to discuss Oncotype results.  Oncotype resulted in a score of 9 indicating low risk. Dr. Loretha recommended proceeding without chemotherapy and anticipates initiating tamoxifen after completion of radiation therapy.   On review of systems, the patient reports to be doing well overall. She denies breast pain, issues with range of motion, and any other symptoms.    Allergies:  is allergic to no known allergies.  Meds: Current Outpatient Medications  Medication Sig Dispense Refill    oxyCODONE  (ROXICODONE ) 5 MG immediate release tablet Take 1 tablet (5 mg total) by mouth every 6 (six) hours as needed. (Patient not taking: Reported on 04/29/2024) 15 tablet 0   No current facility-administered medications for this encounter.    Physical Findings: The patient is in no acute distress. Patient is alert and oriented.  height is 5' 7 (1.702 m) and weight is 123 lb 6.4 oz (56 kg). Her temperature is 97.9 F (36.6 C). Her blood pressure is 143/56 (abnormal) and her pulse is 87. Her respiration is 18 and oxygen saturation is 100%.  No significant changes. Lungs are clear to auscultation bilaterally. Heart has regular rate and rhythm. No palpable cervical, supraclavicular, or axillary adenopathy. Abdomen soft, non-tender, normal bowel sounds.  Left Breast: no palpable mass, nipple discharge or skin abnormalities Right Breast: Scar from previous lumpectomy at the upper breast.  Surgical incisions in the lower outer quadrant and axilla with well-approximated skin edges, and no evidence of delayed wound healing or infection.  Lab Findings: Lab Results  Component Value Date   WBC 2.6 (L) 11/29/2023   HGB 12.1 11/29/2023   HCT 36.5 11/29/2023   MCV 90 11/29/2023   PLT 128 (L) 11/29/2023    Radiographic Findings: MM Breast Surgical Specimen Result Date: 04/11/2024 CLINICAL DATA:  Post lumpectomy specimen radiograph EXAM: SPECIMEN RADIOGRAPH OF THE RIGHT BREAST COMPARISON:  Previous exam(s). FINDINGS: Status post excision of the right breast. The two radioactive seed and two biopsy marker clips are present and completely intact. IMPRESSION: Specimen radiograph of the right breast. Electronically Signed  By: [name]   On: 04/11/2024 14:23   NM Sentinel Node Inj-No Rpt (Breast) Result Date: 04/11/2024 Lymphoseek was injected by the Nuclear Medicine Technologist for sentinel lymph node localization.   US  RT RADIOACTIVE SEED LOC Result Date: 04/11/2024 CLINICAL DATA:   Patient presents for ultrasound-guided radioactive seed localization 2 sites right breast targeting the 9 o'clock position retroareolar region (heart clip) and 9 o'clock position 5 cm from the nipple (coil clip). EXAM: ULTRASOUND GUIDED RADIOACTIVE SEED LOCALIZATION OF THE RIGHT BREAST x 2 SITES. COMPARISON:  Previous exam(s). FINDINGS: Patient presents for radioactive seed localization prior to surgical excision. I met with the patient and we discussed the procedure of seed localization including benefits and alternatives. We discussed the high likelihood of a successful procedure. We discussed the risks of the procedure including infection, bleeding, tissue injury and further surgery. We discussed the low dose of radioactivity involved in the procedure. Informed, written consent was given. The usual time-out protocol was performed immediately prior to the procedure. A. Using ultrasound guidance, sterile technique, 1% lidocaine  and an I-125 radioactive seed, the targeted biopsy-proven malignancy over the 9 o'clock position of the right breast 5 cm from the nipple was localized using an inferior to superior approach. The follow-up mammogram images confirm the seed in the expected location and were marked for [name]. The seed and coil clip could not be imaged within the field of view on the true lateral image due to its posterior location, although the seed is along the posterior aspect of the coil clip on the cc image in good position. Follow-up survey of the patient confirms presence of the radioactive seed. Order number of I-125 seed:  [id number]. Total activity:  0.238 millicurie reference Date: 03/25/2024 B. Using ultrasound guidance, sterile technique, 1% lidocaine  and an I-125 radioactive seed, the targeted biopsy-proven malignancy over the 9 o'clock position of the retroareolar region was localized using an inferior to superior approach. The follow-up mammogram images confirm the seed in the expected  location and were marked for [name]. Note that there are residual microcalcifications extending 6 mm anterior to the seed/heart clip. Follow-up survey of the patient confirms presence of the radioactive seed. Order number of I-125 seed:  [id number]. Total activity:  0.23 millicurie reference Date: 03/25/2024 The patient tolerated the procedure well and was released from the [facility]. She was given instructions regarding seed removal. IMPRESSION: Radioactive seed localization right breast x 2 sites as described. Electronically Signed   By: Toribio Agreste M.D.   On: 04/11/2024 08:21   MM CLIP PLACEMENT RIGHT Result Date: 04/11/2024 CLINICAL DATA:  Patient presents for ultrasound-guided radioactive seed localization 2 sites right breast targeting the 9 o'clock position retroareolar region (heart clip) and 9 o'clock position 5 cm from the nipple (coil clip). EXAM: ULTRASOUND GUIDED RADIOACTIVE SEED LOCALIZATION OF THE RIGHT BREAST x 2 SITES. COMPARISON:  Previous exam(s). FINDINGS: Patient presents for radioactive seed localization prior to surgical excision. I met with the patient and we discussed the procedure of seed localization including benefits and alternatives. We discussed the high likelihood of a successful procedure. We discussed the risks of the procedure including infection, bleeding, tissue injury and further surgery. We discussed the low dose of radioactivity involved in the procedure. Informed, written consent was given. The usual time-out protocol was performed immediately prior to the procedure. A. Using ultrasound guidance, sterile technique, 1% lidocaine  and an I-125 radioactive seed, the targeted biopsy-proven malignancy over the 9 o'clock position of the  right breast 5 cm from the nipple was localized using an inferior to superior approach. The follow-up mammogram images confirm the seed in the expected location and were marked for [name]. The seed and coil clip could not be imaged within  the field of view on the true lateral image due to its posterior location, although the seed is along the posterior aspect of the coil clip on the cc image in good position. Follow-up survey of the patient confirms presence of the radioactive seed. Order number of I-125 seed:  [id number]. Total activity:  0.238 millicurie reference Date: 03/25/2024 B. Using ultrasound guidance, sterile technique, 1% lidocaine  and an I-125 radioactive seed, the targeted biopsy-proven malignancy over the 9 o'clock position of the retroareolar region was localized using an inferior to superior approach. The follow-up mammogram images confirm the seed in the expected location and were marked for [name]. Note that there are residual microcalcifications extending 6 mm anterior to the seed/heart clip. Follow-up survey of the patient confirms presence of the radioactive seed. Order number of I-125 seed:  [id number]. Total activity:  0.23 millicurie reference Date: 03/25/2024 The patient tolerated the procedure well and was released from the [facility]. She was given instructions regarding seed removal. IMPRESSION: Radioactive seed localization right breast x 2 sites as described. Electronically Signed   By: Toribio Agreste M.D.   On: 04/11/2024 08:21   US  RT RADIOACTIVE SEED EA ADD LESION Result Date: 04/11/2024 CLINICAL DATA:  Patient presents for ultrasound-guided radioactive seed localization 2 sites right breast targeting the 9 o'clock position retroareolar region (heart clip) and 9 o'clock position 5 cm from the nipple (coil clip). EXAM: ULTRASOUND GUIDED RADIOACTIVE SEED LOCALIZATION OF THE RIGHT BREAST x 2 SITES. COMPARISON:  Previous exam(s). FINDINGS: Patient presents for radioactive seed localization prior to surgical excision. I met with the patient and we discussed the procedure of seed localization including benefits and alternatives. We discussed the high likelihood of a successful procedure. We discussed the risks of the  procedure including infection, bleeding, tissue injury and further surgery. We discussed the low dose of radioactivity involved in the procedure. Informed, written consent was given. The usual time-out protocol was performed immediately prior to the procedure. A. Using ultrasound guidance, sterile technique, 1% lidocaine  and an I-125 radioactive seed, the targeted biopsy-proven malignancy over the 9 o'clock position of the right breast 5 cm from the nipple was localized using an inferior to superior approach. The follow-up mammogram images confirm the seed in the expected location and were marked for [name]. The seed and coil clip could not be imaged within the field of view on the true lateral image due to its posterior location, although the seed is along the posterior aspect of the coil clip on the cc image in good position. Follow-up survey of the patient confirms presence of the radioactive seed. Order number of I-125 seed:  [id number]. Total activity:  0.238 millicurie reference Date: 03/25/2024 B. Using ultrasound guidance, sterile technique, 1% lidocaine  and an I-125 radioactive seed, the targeted biopsy-proven malignancy over the 9 o'clock position of the retroareolar region was localized using an inferior to superior approach. The follow-up mammogram images confirm the seed in the expected location and were marked for [name]. Note that there are residual microcalcifications extending 6 mm anterior to the seed/heart clip. Follow-up survey of the patient confirms presence of the radioactive seed. Order number of I-125 seed:  [id number]. Total activity:  0.23 millicurie reference Date: 03/25/2024 The patient tolerated  the procedure well and was released from the [facility]. She was given instructions regarding seed removal. IMPRESSION: Radioactive seed localization right breast x 2 sites as described. Electronically Signed   By: Toribio Agreste M.D.   On: 04/11/2024 08:21    Impression: Stage IA  (pT1b,  N0, M0) Intermediate grade Invasive Ductal Carcinoma of the Right Breast, ER+ / PR+ / Her2-; s/p right lumpectomy and SLN biopsy on 04/11/2024  Patient continues to heal from her lumpectomy. Dr. Izell recommends radiation to reduce the risk of locoregional disease recurrence. Today we discussed the natural course of early stage breast cancer. We highlighted the role of radiotherapy in the management and focused on the details of logistics and delivery. We reviewed the anticipated acute and late sequelae associated with radiation in this setting. The patient was encouraged to ask questions that I answered to the best of my ability. Patient expressed readiness to proceed with treatment. A consent form was reviewed and signed today.    Plan:  Patient has a trip planned to [place] to visit her mother on 07/10 - 08/10. We will schedule her CT simulation on 8/11 and her first treatment on 8/13. Dr. Izell anticipates a 4-week course of radiation treatment.   -----------------------------------   Leeroy Due, [name]

## 2024-04-29 NOTE — Progress Notes (Signed)
 Location of Breast Cancer:  Histology per Pathology Report:   Receptor Status: ER(100% positive ), PR ( 30% positive), Her2-neu ( 1+), Ki-67(10% )  Did patient present with symptoms (if so, please note symptoms) or was this found on screening mammography?:   Past/Anticipated interventions by surgeon, if any:  Past/Anticipated interventions by medical oncology, if any:  Plan for radiation therapy to last approximately four weeks. - Initiate tamoxifen after completion of radiation therapy. - Schedule follow-up appointment for end of September to start tamoxifen.  Amber Stalls, MD 04/28/24   Lymphedema issues, if jwb:Izwpzd  Pain issues, if any:  Denies   SAFETY ISSUES: Prior radiation? No Pacemaker/ICD? No Possible current pregnancy?No Is the patient on methotrexate? No  Current Complaints / other details:       BP (!) 143/56 (BP Location: Left Arm, Patient Position: Sitting, Cuff Size: Normal)   Pulse 87   Temp 97.9 F (36.6 C)   Resp 18   Ht 5' 7 (1.702 m)   Wt 123 lb 6.4 oz (56 kg)   LMP 04/22/2020   SpO2 100%   BMI 19.33 kg/m

## 2024-05-06 ENCOUNTER — Encounter: Payer: Self-pay | Admitting: *Deleted

## 2024-05-20 ENCOUNTER — Telehealth: Payer: Self-pay

## 2024-05-20 NOTE — Telephone Encounter (Signed)
 Unable to complete the call to the patient.

## 2024-05-21 ENCOUNTER — Inpatient Hospital Stay: Attending: Hematology and Oncology | Admitting: Hematology and Oncology

## 2024-05-23 ENCOUNTER — Telehealth: Payer: Self-pay | Admitting: Genetic Counselor

## 2024-05-23 NOTE — Telephone Encounter (Signed)
 Called to schedule genetics appt per inbasket. Unable to LVM to return call for scheduling.

## 2024-05-30 ENCOUNTER — Ambulatory Visit
Admission: RE | Admit: 2024-05-30 | Discharge: 2024-05-30 | Disposition: A | Discharge status: Home / Self Care | Source: Ambulatory Visit | Attending: Hematology and Oncology | Admitting: Hematology and Oncology

## 2024-05-30 DIAGNOSIS — Z170 Estrogen receptor positive status [ER+]: Secondary | ICD-10-CM | POA: Insufficient documentation

## 2024-05-30 DIAGNOSIS — C50411 Malignant neoplasm of upper-outer quadrant of right female breast: Secondary | ICD-10-CM | POA: Diagnosis not present

## 2024-06-02 ENCOUNTER — Encounter: Payer: Self-pay | Admitting: *Deleted

## 2024-06-02 DIAGNOSIS — C50411 Malignant neoplasm of upper-outer quadrant of right female breast: Secondary | ICD-10-CM

## 2024-06-09 ENCOUNTER — Ambulatory Visit: Admitting: Radiation Oncology

## 2024-06-09 ENCOUNTER — Ambulatory Visit
Admission: RE | Admit: 2024-06-09 | Discharge: 2024-06-09 | Disposition: A | Discharge status: Home / Self Care | Source: Ambulatory Visit | Attending: Radiation Oncology | Admitting: Radiation Oncology

## 2024-06-09 ENCOUNTER — Other Ambulatory Visit: Payer: Self-pay

## 2024-06-09 DIAGNOSIS — Z170 Estrogen receptor positive status [ER+]: Secondary | ICD-10-CM

## 2024-06-09 DIAGNOSIS — Z510 Encounter for antineoplastic radiation therapy: Secondary | ICD-10-CM | POA: Diagnosis not present

## 2024-06-09 DIAGNOSIS — C50411 Malignant neoplasm of upper-outer quadrant of right female breast: Secondary | ICD-10-CM | POA: Diagnosis not present

## 2024-06-09 LAB — RAD ONC ARIA SESSION SUMMARY
Course Elapsed Days: 0
Plan Fractions Treated to Date: 1
Plan Prescribed Dose Per Fraction: 2.67 Gy
Plan Total Fractions Prescribed: 15
Plan Total Prescribed Dose: 40.05 Gy
Reference Point Dosage Given to Date: 2.67 Gy
Reference Point Session Dosage Given: 2.67 Gy
Session Number: 1

## 2024-06-09 MED ORDER — ALRA NON-METALLIC DEODORANT (RAD-ONC): 1.0000 | Freq: Once | TOPICAL | Status: DC

## 2024-06-09 MED ORDER — RADIAPLEXRX EX GEL: Freq: Once | CUTANEOUS | Status: AC

## 2024-06-09 MED ORDER — RADIAPLEXRX EX GEL: Freq: Once | CUTANEOUS | Status: DC

## 2024-06-09 MED ORDER — ALRA NON-METALLIC DEODORANT (RAD-ONC)
1.0000 | Freq: Once | TOPICAL | Status: AC
  Administered 2024-06-09 (×2): 1 via TOPICAL

## 2024-06-10 ENCOUNTER — Encounter: Payer: Self-pay | Admitting: Genetic Counselor

## 2024-06-10 ENCOUNTER — Ambulatory Visit
Admission: RE | Admit: 2024-06-10 | Discharge: 2024-06-10 | Disposition: A | Discharge status: Home / Self Care | Source: Ambulatory Visit | Attending: Radiation Oncology

## 2024-06-10 ENCOUNTER — Other Ambulatory Visit: Payer: Self-pay

## 2024-06-10 ENCOUNTER — Telehealth: Payer: Self-pay | Admitting: Genetic Counselor

## 2024-06-10 DIAGNOSIS — C50411 Malignant neoplasm of upper-outer quadrant of right female breast: Secondary | ICD-10-CM | POA: Diagnosis not present

## 2024-06-10 DIAGNOSIS — Z510 Encounter for antineoplastic radiation therapy: Secondary | ICD-10-CM | POA: Diagnosis not present

## 2024-06-10 DIAGNOSIS — Z170 Estrogen receptor positive status [ER+]: Secondary | ICD-10-CM | POA: Diagnosis not present

## 2024-06-10 LAB — RAD ONC ARIA SESSION SUMMARY
Course Elapsed Days: 1
Plan Fractions Treated to Date: 2
Plan Prescribed Dose Per Fraction: 2.67 Gy
Plan Total Fractions Prescribed: 15
Plan Total Prescribed Dose: 40.05 Gy
Reference Point Dosage Given to Date: 5.34 Gy
Reference Point Session Dosage Given: 2.67 Gy
Session Number: 2

## 2024-06-10 NOTE — Telephone Encounter (Signed)
 Called to schedule genetics appt. 3rd attempt. LVM to return call for scheduling.

## 2024-06-11 ENCOUNTER — Other Ambulatory Visit: Payer: Self-pay

## 2024-06-11 ENCOUNTER — Ambulatory Visit: Admitting: Radiation Oncology

## 2024-06-11 ENCOUNTER — Ambulatory Visit
Admission: RE | Admit: 2024-06-11 | Discharge: 2024-06-11 | Disposition: A | Discharge status: Home / Self Care | Source: Ambulatory Visit | Attending: Radiation Oncology | Admitting: Radiation Oncology

## 2024-06-11 DIAGNOSIS — Z170 Estrogen receptor positive status [ER+]: Secondary | ICD-10-CM | POA: Diagnosis not present

## 2024-06-11 DIAGNOSIS — C50411 Malignant neoplasm of upper-outer quadrant of right female breast: Secondary | ICD-10-CM | POA: Diagnosis not present

## 2024-06-11 DIAGNOSIS — Z510 Encounter for antineoplastic radiation therapy: Secondary | ICD-10-CM | POA: Diagnosis not present

## 2024-06-11 LAB — RAD ONC ARIA SESSION SUMMARY
Course Elapsed Days: 2
Plan Fractions Treated to Date: 3
Plan Prescribed Dose Per Fraction: 2.67 Gy
Plan Total Fractions Prescribed: 15
Plan Total Prescribed Dose: 40.05 Gy
Reference Point Dosage Given to Date: 8.01 Gy
Reference Point Session Dosage Given: 2.67 Gy
Session Number: 3

## 2024-06-12 ENCOUNTER — Ambulatory Visit
Admission: RE | Admit: 2024-06-12 | Discharge: 2024-06-12 | Disposition: A | Discharge status: Home / Self Care | Source: Ambulatory Visit | Attending: Radiation Oncology | Admitting: Radiation Oncology

## 2024-06-12 ENCOUNTER — Other Ambulatory Visit: Payer: Self-pay

## 2024-06-12 DIAGNOSIS — Z510 Encounter for antineoplastic radiation therapy: Secondary | ICD-10-CM | POA: Diagnosis not present

## 2024-06-12 DIAGNOSIS — C50411 Malignant neoplasm of upper-outer quadrant of right female breast: Secondary | ICD-10-CM | POA: Diagnosis not present

## 2024-06-12 DIAGNOSIS — Z170 Estrogen receptor positive status [ER+]: Secondary | ICD-10-CM | POA: Diagnosis not present

## 2024-06-12 LAB — RAD ONC ARIA SESSION SUMMARY
Course Elapsed Days: 3
Plan Fractions Treated to Date: 4
Plan Prescribed Dose Per Fraction: 2.67 Gy
Plan Total Fractions Prescribed: 15
Plan Total Prescribed Dose: 40.05 Gy
Reference Point Dosage Given to Date: 10.68 Gy
Reference Point Session Dosage Given: 2.67 Gy
Session Number: 4

## 2024-06-13 ENCOUNTER — Other Ambulatory Visit: Payer: Self-pay

## 2024-06-13 ENCOUNTER — Ambulatory Visit: Admitting: Internal Medicine

## 2024-06-13 ENCOUNTER — Ambulatory Visit
Admission: RE | Admit: 2024-06-13 | Discharge: 2024-06-13 | Disposition: A | Discharge status: Home / Self Care | Source: Ambulatory Visit | Attending: Radiation Oncology | Admitting: Radiation Oncology

## 2024-06-13 ENCOUNTER — Encounter: Payer: Self-pay | Admitting: Internal Medicine

## 2024-06-13 VITALS — BP 110/70 | HR 101 | Temp 98.3°F | Resp 18 | Wt 112.5 lb

## 2024-06-13 DIAGNOSIS — K2900 Acute gastritis without bleeding: Secondary | ICD-10-CM

## 2024-06-13 DIAGNOSIS — C50411 Malignant neoplasm of upper-outer quadrant of right female breast: Secondary | ICD-10-CM | POA: Diagnosis not present

## 2024-06-13 DIAGNOSIS — Z170 Estrogen receptor positive status [ER+]: Secondary | ICD-10-CM | POA: Diagnosis not present

## 2024-06-13 DIAGNOSIS — F419 Anxiety disorder, unspecified: Secondary | ICD-10-CM

## 2024-06-13 DIAGNOSIS — R110 Nausea: Secondary | ICD-10-CM | POA: Diagnosis not present

## 2024-06-13 DIAGNOSIS — Z510 Encounter for antineoplastic radiation therapy: Secondary | ICD-10-CM | POA: Diagnosis not present

## 2024-06-13 LAB — RAD ONC ARIA SESSION SUMMARY
Course Elapsed Days: 4
Plan Fractions Treated to Date: 5
Plan Prescribed Dose Per Fraction: 2.67 Gy
Plan Total Fractions Prescribed: 15
Plan Total Prescribed Dose: 40.05 Gy
Reference Point Dosage Given to Date: 13.35 Gy
Reference Point Session Dosage Given: 2.67 Gy
Session Number: 5

## 2024-06-13 MED ORDER — ESCITALOPRAM OXALATE 10 MG PO TABS: 10.0000 mg | ORAL_TABLET | Freq: Every day | ORAL | 3 refills | Status: AC

## 2024-06-13 MED ORDER — ONDANSETRON HCL 4 MG PO TABS: 4.0000 mg | ORAL_TABLET | Freq: Three times a day (TID) | ORAL | 0 refills | Status: DC | PRN

## 2024-06-13 MED ORDER — PANTOPRAZOLE SODIUM 40 MG PO TBEC: 40.0000 mg | DELAYED_RELEASE_TABLET | Freq: Every day | ORAL | 3 refills | Status: DC

## 2024-06-13 NOTE — Progress Notes (Signed)
   Office Visit  Subjective   Patient ID: Tonya Summers   DOB: 01-30-1976   Age: 48 y.o.   MRN: [id number]   Chief Complaint Chief Complaint  Patient presents with   office visit    Stomach pain      History of Present Illness 48 years old female with history of breast cancer who is currently getting radiation therapy.  She says that she has stopped taking escitalopram  as she was feeling better.  She says that she started feeling worse again.  She does not have appetite and cannot eat much. Husband says that patient has  negative thinking again as it was before.  She says that she has lost some weight.  She was given radiation for cancer.  She also admitted that she is under stress and depressed as well.  She says that every time she eats she become nauseous.  Past Medical History Past Medical History:  Diagnosis Date   Anxiety    Asthma    GERD (gastroesophageal reflux disease)      Allergies Allergies  Allergen Reactions   No Known Allergies      Review of Systems Review of Systems  Constitutional: Negative.   Respiratory: Negative.    Gastrointestinal:  Positive for nausea.  Psychiatric/Behavioral:  Positive for depression.        Objective:    Vitals BP 110/70   Pulse (!) 101   Temp 98.3 F (36.8 C)   Resp 18   Wt 112 lb 8 oz (51 kg)   LMP 04/22/2020   SpO2 99%   BMI 17.62 kg/m    Physical Examination Physical Exam Constitutional:      Appearance: Normal appearance.  HENT:     Head: Normocephalic and atraumatic.  Cardiovascular:     Rate and Rhythm: Normal rate and regular rhythm.     Heart sounds: Normal heart sounds.  Pulmonary:     Effort: Pulmonary effort is normal.     Breath sounds: Normal breath sounds.  Abdominal:     General: Bowel sounds are normal.     Palpations: Abdomen is soft.  Neurological:     Mental Status: She is alert.        Assessment & Plan:   Acute gastritis without hemorrhage   He probably has stress-induced  gastritis.  I have suggested her to take Protonix  40 mg daily for 3 weeks and take ondansetron  4 mg 30 minutes before meal.  If she is not better then she will come back.  Nausea without vomiting   She will take Zofran  30 minutes before meal  Anxiety  She is anxious and probably depressed as well.  I will restart her on escitalopram  10 mg daily.  She does not have any suicidal ideation.  If she is not better then she will come back.    Return in about 1 month (around 07/14/2024).   Roetta Dare, MD

## 2024-06-16 ENCOUNTER — Ambulatory Visit
Admission: RE | Admit: 2024-06-16 | Discharge: 2024-06-16 | Disposition: A | Discharge status: Home / Self Care | Source: Ambulatory Visit | Attending: Radiation Oncology

## 2024-06-16 ENCOUNTER — Other Ambulatory Visit: Payer: Self-pay

## 2024-06-16 ENCOUNTER — Ambulatory Visit
Admission: RE | Admit: 2024-06-16 | Discharge: 2024-06-16 | Disposition: A | Discharge status: Home / Self Care | Source: Ambulatory Visit | Attending: Radiation Oncology | Admitting: Radiation Oncology

## 2024-06-16 DIAGNOSIS — Z170 Estrogen receptor positive status [ER+]: Secondary | ICD-10-CM | POA: Diagnosis not present

## 2024-06-16 DIAGNOSIS — Z510 Encounter for antineoplastic radiation therapy: Secondary | ICD-10-CM | POA: Diagnosis not present

## 2024-06-16 DIAGNOSIS — C50411 Malignant neoplasm of upper-outer quadrant of right female breast: Secondary | ICD-10-CM | POA: Diagnosis not present

## 2024-06-16 LAB — RAD ONC ARIA SESSION SUMMARY
Course Elapsed Days: 7
Plan Fractions Treated to Date: 6
Plan Prescribed Dose Per Fraction: 2.67 Gy
Plan Total Fractions Prescribed: 15
Plan Total Prescribed Dose: 40.05 Gy
Reference Point Dosage Given to Date: 16.02 Gy
Reference Point Session Dosage Given: 2.67 Gy
Session Number: 6

## 2024-06-17 ENCOUNTER — Ambulatory Visit
Admission: RE | Admit: 2024-06-17 | Discharge: 2024-06-17 | Disposition: A | Discharge status: Home / Self Care | Source: Ambulatory Visit | Attending: Radiation Oncology | Admitting: Radiation Oncology

## 2024-06-17 ENCOUNTER — Other Ambulatory Visit: Payer: Self-pay

## 2024-06-17 DIAGNOSIS — Z170 Estrogen receptor positive status [ER+]: Secondary | ICD-10-CM | POA: Diagnosis not present

## 2024-06-17 DIAGNOSIS — Z510 Encounter for antineoplastic radiation therapy: Secondary | ICD-10-CM | POA: Diagnosis not present

## 2024-06-17 DIAGNOSIS — C50411 Malignant neoplasm of upper-outer quadrant of right female breast: Secondary | ICD-10-CM | POA: Diagnosis not present

## 2024-06-17 LAB — RAD ONC ARIA SESSION SUMMARY
Course Elapsed Days: 8
Plan Fractions Treated to Date: 7
Plan Prescribed Dose Per Fraction: 2.67 Gy
Plan Total Fractions Prescribed: 15
Plan Total Prescribed Dose: 40.05 Gy
Reference Point Dosage Given to Date: 18.69 Gy
Reference Point Session Dosage Given: 2.67 Gy
Session Number: 7

## 2024-06-18 ENCOUNTER — Ambulatory Visit
Admission: RE | Admit: 2024-06-18 | Discharge: 2024-06-18 | Disposition: A | Discharge status: Home / Self Care | Source: Ambulatory Visit | Attending: Radiation Oncology | Admitting: Radiation Oncology

## 2024-06-18 ENCOUNTER — Other Ambulatory Visit: Payer: Self-pay

## 2024-06-18 DIAGNOSIS — Z170 Estrogen receptor positive status [ER+]: Secondary | ICD-10-CM | POA: Diagnosis not present

## 2024-06-18 DIAGNOSIS — C50411 Malignant neoplasm of upper-outer quadrant of right female breast: Secondary | ICD-10-CM | POA: Diagnosis not present

## 2024-06-18 DIAGNOSIS — Z510 Encounter for antineoplastic radiation therapy: Secondary | ICD-10-CM | POA: Diagnosis not present

## 2024-06-18 LAB — RAD ONC ARIA SESSION SUMMARY
Course Elapsed Days: 9
Plan Fractions Treated to Date: 8
Plan Prescribed Dose Per Fraction: 2.67 Gy
Plan Total Fractions Prescribed: 15
Plan Total Prescribed Dose: 40.05 Gy
Reference Point Dosage Given to Date: 21.36 Gy
Reference Point Session Dosage Given: 2.67 Gy
Session Number: 8

## 2024-06-19 ENCOUNTER — Other Ambulatory Visit: Payer: Self-pay

## 2024-06-19 ENCOUNTER — Ambulatory Visit
Admission: RE | Admit: 2024-06-19 | Discharge: 2024-06-19 | Disposition: A | Discharge status: Home / Self Care | Source: Ambulatory Visit | Attending: Radiation Oncology | Admitting: Radiation Oncology

## 2024-06-19 DIAGNOSIS — C50411 Malignant neoplasm of upper-outer quadrant of right female breast: Secondary | ICD-10-CM | POA: Diagnosis not present

## 2024-06-19 DIAGNOSIS — Z510 Encounter for antineoplastic radiation therapy: Secondary | ICD-10-CM | POA: Diagnosis not present

## 2024-06-19 DIAGNOSIS — Z170 Estrogen receptor positive status [ER+]: Secondary | ICD-10-CM | POA: Diagnosis not present

## 2024-06-19 LAB — RAD ONC ARIA SESSION SUMMARY
Course Elapsed Days: 10
Plan Fractions Treated to Date: 9
Plan Prescribed Dose Per Fraction: 2.67 Gy
Plan Total Fractions Prescribed: 15
Plan Total Prescribed Dose: 40.05 Gy
Reference Point Dosage Given to Date: 24.03 Gy
Reference Point Session Dosage Given: 2.67 Gy
Session Number: 9

## 2024-06-20 ENCOUNTER — Ambulatory Visit
Admission: RE | Admit: 2024-06-20 | Discharge: 2024-06-20 | Disposition: A | Discharge status: Home / Self Care | Source: Ambulatory Visit | Attending: Radiation Oncology | Admitting: Radiation Oncology

## 2024-06-20 ENCOUNTER — Other Ambulatory Visit: Payer: Self-pay

## 2024-06-20 DIAGNOSIS — Z510 Encounter for antineoplastic radiation therapy: Secondary | ICD-10-CM | POA: Diagnosis not present

## 2024-06-20 DIAGNOSIS — C50411 Malignant neoplasm of upper-outer quadrant of right female breast: Secondary | ICD-10-CM | POA: Diagnosis not present

## 2024-06-20 DIAGNOSIS — Z170 Estrogen receptor positive status [ER+]: Secondary | ICD-10-CM | POA: Diagnosis not present

## 2024-06-20 LAB — RAD ONC ARIA SESSION SUMMARY
Course Elapsed Days: 11
Plan Fractions Treated to Date: 10
Plan Prescribed Dose Per Fraction: 2.67 Gy
Plan Total Fractions Prescribed: 15
Plan Total Prescribed Dose: 40.05 Gy
Reference Point Dosage Given to Date: 26.7 Gy
Reference Point Session Dosage Given: 2.67 Gy
Session Number: 10

## 2024-06-23 ENCOUNTER — Ambulatory Visit
Admission: RE | Admit: 2024-06-23 | Discharge: 2024-06-23 | Disposition: A | Discharge status: Home / Self Care | Source: Ambulatory Visit | Attending: Radiation Oncology

## 2024-06-23 ENCOUNTER — Ambulatory Visit
Admission: RE | Admit: 2024-06-23 | Discharge: 2024-06-23 | Disposition: A | Discharge status: Home / Self Care | Source: Ambulatory Visit | Attending: Radiation Oncology | Admitting: Radiation Oncology

## 2024-06-23 ENCOUNTER — Other Ambulatory Visit: Payer: Self-pay

## 2024-06-23 DIAGNOSIS — C50411 Malignant neoplasm of upper-outer quadrant of right female breast: Secondary | ICD-10-CM | POA: Diagnosis not present

## 2024-06-23 DIAGNOSIS — Z510 Encounter for antineoplastic radiation therapy: Secondary | ICD-10-CM | POA: Diagnosis not present

## 2024-06-23 DIAGNOSIS — Z170 Estrogen receptor positive status [ER+]: Secondary | ICD-10-CM | POA: Diagnosis not present

## 2024-06-23 LAB — RAD ONC ARIA SESSION SUMMARY
Course Elapsed Days: 14
Plan Fractions Treated to Date: 11
Plan Prescribed Dose Per Fraction: 2.67 Gy
Plan Total Fractions Prescribed: 15
Plan Total Prescribed Dose: 40.05 Gy
Reference Point Dosage Given to Date: 29.37 Gy
Reference Point Session Dosage Given: 2.67 Gy
Session Number: 11

## 2024-06-24 ENCOUNTER — Ambulatory Visit
Admission: RE | Admit: 2024-06-24 | Discharge: 2024-06-24 | Disposition: A | Discharge status: Home / Self Care | Source: Ambulatory Visit | Attending: Radiation Oncology

## 2024-06-24 ENCOUNTER — Other Ambulatory Visit: Payer: Self-pay

## 2024-06-24 DIAGNOSIS — Z510 Encounter for antineoplastic radiation therapy: Secondary | ICD-10-CM | POA: Diagnosis not present

## 2024-06-24 DIAGNOSIS — C50411 Malignant neoplasm of upper-outer quadrant of right female breast: Secondary | ICD-10-CM | POA: Diagnosis not present

## 2024-06-24 DIAGNOSIS — Z170 Estrogen receptor positive status [ER+]: Secondary | ICD-10-CM | POA: Diagnosis not present

## 2024-06-24 LAB — RAD ONC ARIA SESSION SUMMARY
Course Elapsed Days: 15
Plan Fractions Treated to Date: 12
Plan Prescribed Dose Per Fraction: 2.67 Gy
Plan Total Fractions Prescribed: 15
Plan Total Prescribed Dose: 40.05 Gy
Reference Point Dosage Given to Date: 32.04 Gy
Reference Point Session Dosage Given: 2.67 Gy
Session Number: 12

## 2024-06-25 ENCOUNTER — Other Ambulatory Visit: Payer: Self-pay

## 2024-06-25 ENCOUNTER — Ambulatory Visit
Admission: RE | Admit: 2024-06-25 | Discharge: 2024-06-25 | Discharge status: Home / Self Care | Source: Ambulatory Visit | Attending: Radiation Oncology

## 2024-06-25 DIAGNOSIS — C50411 Malignant neoplasm of upper-outer quadrant of right female breast: Secondary | ICD-10-CM | POA: Diagnosis not present

## 2024-06-25 DIAGNOSIS — Z170 Estrogen receptor positive status [ER+]: Secondary | ICD-10-CM | POA: Diagnosis not present

## 2024-06-25 DIAGNOSIS — Z510 Encounter for antineoplastic radiation therapy: Secondary | ICD-10-CM | POA: Diagnosis not present

## 2024-06-25 LAB — RAD ONC ARIA SESSION SUMMARY
Course Elapsed Days: 16
Plan Fractions Treated to Date: 13
Plan Prescribed Dose Per Fraction: 2.67 Gy
Plan Total Fractions Prescribed: 15
Plan Total Prescribed Dose: 40.05 Gy
Reference Point Dosage Given to Date: 34.71 Gy
Reference Point Session Dosage Given: 2.67 Gy
Session Number: 13

## 2024-06-26 ENCOUNTER — Ambulatory Visit
Admission: RE | Admit: 2024-06-26 | Discharge: 2024-06-26 | Disposition: A | Discharge status: Home / Self Care | Source: Ambulatory Visit | Attending: Radiation Oncology | Admitting: Radiation Oncology

## 2024-06-26 ENCOUNTER — Other Ambulatory Visit: Payer: Self-pay

## 2024-06-26 DIAGNOSIS — Z170 Estrogen receptor positive status [ER+]: Secondary | ICD-10-CM | POA: Diagnosis not present

## 2024-06-26 DIAGNOSIS — C50411 Malignant neoplasm of upper-outer quadrant of right female breast: Secondary | ICD-10-CM | POA: Diagnosis not present

## 2024-06-26 DIAGNOSIS — Z510 Encounter for antineoplastic radiation therapy: Secondary | ICD-10-CM | POA: Diagnosis not present

## 2024-06-26 LAB — RAD ONC ARIA SESSION SUMMARY
Course Elapsed Days: 17
Plan Fractions Treated to Date: 14
Plan Prescribed Dose Per Fraction: 2.67 Gy
Plan Total Fractions Prescribed: 15
Plan Total Prescribed Dose: 40.05 Gy
Reference Point Dosage Given to Date: 37.38 Gy
Reference Point Session Dosage Given: 2.67 Gy
Session Number: 14

## 2024-06-27 ENCOUNTER — Ambulatory Visit
Admission: RE | Admit: 2024-06-27 | Discharge: 2024-06-27 | Disposition: A | Discharge status: Home / Self Care | Source: Ambulatory Visit | Attending: Radiation Oncology | Admitting: Radiation Oncology

## 2024-06-27 ENCOUNTER — Other Ambulatory Visit: Payer: Self-pay

## 2024-06-27 DIAGNOSIS — Z170 Estrogen receptor positive status [ER+]: Secondary | ICD-10-CM | POA: Diagnosis not present

## 2024-06-27 DIAGNOSIS — Z510 Encounter for antineoplastic radiation therapy: Secondary | ICD-10-CM | POA: Diagnosis not present

## 2024-06-27 DIAGNOSIS — C50411 Malignant neoplasm of upper-outer quadrant of right female breast: Secondary | ICD-10-CM | POA: Diagnosis not present

## 2024-06-27 LAB — RAD ONC ARIA SESSION SUMMARY
Course Elapsed Days: 18
Plan Fractions Treated to Date: 15
Plan Prescribed Dose Per Fraction: 2.67 Gy
Plan Total Fractions Prescribed: 15
Plan Total Prescribed Dose: 40.05 Gy
Reference Point Dosage Given to Date: 40.05 Gy
Reference Point Session Dosage Given: 2.67 Gy
Session Number: 15

## 2024-07-01 ENCOUNTER — Ambulatory Visit
Admission: RE | Admit: 2024-07-01 | Discharge: 2024-07-01 | Disposition: A | Discharge status: Home / Self Care | Source: Ambulatory Visit | Attending: Radiation Oncology | Admitting: Radiation Oncology

## 2024-07-01 ENCOUNTER — Other Ambulatory Visit: Payer: Self-pay

## 2024-07-01 ENCOUNTER — Ambulatory Visit
Admission: RE | Admit: 2024-07-01 | Discharge: 2024-07-01 | Disposition: A | Discharge status: Home / Self Care | Source: Ambulatory Visit | Attending: Hematology and Oncology | Admitting: Hematology and Oncology

## 2024-07-01 ENCOUNTER — Ambulatory Visit

## 2024-07-01 DIAGNOSIS — C50411 Malignant neoplasm of upper-outer quadrant of right female breast: Secondary | ICD-10-CM | POA: Diagnosis not present

## 2024-07-01 DIAGNOSIS — Z170 Estrogen receptor positive status [ER+]: Secondary | ICD-10-CM | POA: Insufficient documentation

## 2024-07-01 LAB — RAD ONC ARIA SESSION SUMMARY
Course Elapsed Days: 22
Plan Fractions Treated to Date: 1
Plan Prescribed Dose Per Fraction: 2 Gy
Plan Total Fractions Prescribed: 5
Plan Total Prescribed Dose: 10 Gy
Reference Point Dosage Given to Date: 2 Gy
Reference Point Session Dosage Given: 2 Gy
Session Number: 16

## 2024-07-02 ENCOUNTER — Ambulatory Visit

## 2024-07-02 ENCOUNTER — Ambulatory Visit: Admitting: Internal Medicine

## 2024-07-02 ENCOUNTER — Ambulatory Visit
Admission: RE | Admit: 2024-07-02 | Discharge: 2024-07-02 | Disposition: A | Discharge status: Home / Self Care | Source: Ambulatory Visit | Attending: Radiation Oncology | Admitting: Radiation Oncology

## 2024-07-02 ENCOUNTER — Other Ambulatory Visit: Payer: Self-pay

## 2024-07-02 DIAGNOSIS — Z170 Estrogen receptor positive status [ER+]: Secondary | ICD-10-CM | POA: Diagnosis not present

## 2024-07-02 DIAGNOSIS — C50411 Malignant neoplasm of upper-outer quadrant of right female breast: Secondary | ICD-10-CM | POA: Diagnosis not present

## 2024-07-02 LAB — RAD ONC ARIA SESSION SUMMARY
Course Elapsed Days: 23
Plan Fractions Treated to Date: 2
Plan Prescribed Dose Per Fraction: 2 Gy
Plan Total Fractions Prescribed: 5
Plan Total Prescribed Dose: 10 Gy
Reference Point Dosage Given to Date: 4 Gy
Reference Point Session Dosage Given: 2 Gy
Session Number: 17

## 2024-07-03 ENCOUNTER — Ambulatory Visit

## 2024-07-03 ENCOUNTER — Other Ambulatory Visit: Payer: Self-pay

## 2024-07-03 ENCOUNTER — Ambulatory Visit
Admission: RE | Admit: 2024-07-03 | Discharge: 2024-07-03 | Disposition: A | Discharge status: Home / Self Care | Source: Ambulatory Visit | Attending: Radiation Oncology | Admitting: Radiation Oncology

## 2024-07-03 DIAGNOSIS — C50411 Malignant neoplasm of upper-outer quadrant of right female breast: Secondary | ICD-10-CM | POA: Diagnosis not present

## 2024-07-03 DIAGNOSIS — Z170 Estrogen receptor positive status [ER+]: Secondary | ICD-10-CM | POA: Diagnosis not present

## 2024-07-03 LAB — RAD ONC ARIA SESSION SUMMARY
Course Elapsed Days: 24
Plan Fractions Treated to Date: 3
Plan Prescribed Dose Per Fraction: 2 Gy
Plan Total Fractions Prescribed: 5
Plan Total Prescribed Dose: 10 Gy
Reference Point Dosage Given to Date: 6 Gy
Reference Point Session Dosage Given: 2 Gy
Session Number: 18

## 2024-07-04 ENCOUNTER — Other Ambulatory Visit: Payer: Self-pay

## 2024-07-04 ENCOUNTER — Ambulatory Visit
Admission: RE | Admit: 2024-07-04 | Discharge: 2024-07-04 | Disposition: A | Discharge status: Home / Self Care | Source: Ambulatory Visit | Attending: Radiation Oncology | Admitting: Radiation Oncology

## 2024-07-04 DIAGNOSIS — C50411 Malignant neoplasm of upper-outer quadrant of right female breast: Secondary | ICD-10-CM | POA: Diagnosis not present

## 2024-07-04 DIAGNOSIS — Z170 Estrogen receptor positive status [ER+]: Secondary | ICD-10-CM | POA: Diagnosis not present

## 2024-07-04 LAB — RAD ONC ARIA SESSION SUMMARY
Course Elapsed Days: 25
Plan Fractions Treated to Date: 4
Plan Prescribed Dose Per Fraction: 2 Gy
Plan Total Fractions Prescribed: 5
Plan Total Prescribed Dose: 10 Gy
Reference Point Dosage Given to Date: 8 Gy
Reference Point Session Dosage Given: 2 Gy
Session Number: 19

## 2024-07-07 ENCOUNTER — Ambulatory Visit
Admission: RE | Admit: 2024-07-07 | Discharge: 2024-07-07 | Disposition: A | Discharge status: Home / Self Care | Source: Ambulatory Visit | Attending: Radiation Oncology

## 2024-07-07 ENCOUNTER — Other Ambulatory Visit: Payer: Self-pay

## 2024-07-07 ENCOUNTER — Ambulatory Visit

## 2024-07-07 DIAGNOSIS — Z510 Encounter for antineoplastic radiation therapy: Secondary | ICD-10-CM | POA: Diagnosis not present

## 2024-07-07 DIAGNOSIS — Z170 Estrogen receptor positive status [ER+]: Secondary | ICD-10-CM | POA: Diagnosis not present

## 2024-07-07 DIAGNOSIS — C50411 Malignant neoplasm of upper-outer quadrant of right female breast: Secondary | ICD-10-CM | POA: Diagnosis not present

## 2024-07-07 LAB — RAD ONC ARIA SESSION SUMMARY
Course Elapsed Days: 28
Plan Fractions Treated to Date: 5
Plan Prescribed Dose Per Fraction: 2 Gy
Plan Total Fractions Prescribed: 5
Plan Total Prescribed Dose: 10 Gy
Reference Point Dosage Given to Date: 10 Gy
Reference Point Session Dosage Given: 2 Gy
Session Number: 20

## 2024-07-08 ENCOUNTER — Ambulatory Visit

## 2024-07-08 NOTE — Radiation Completion Notes (Signed)
 Patient Name: Tonya Summers, Tonya Summers MRN: [id number] Date of Birth: Mar 04, 1976 Referring Physician: AMBER STALLS, M.D. Date of Service: 2024-07-08 Radiation Oncologist: Lauraine Golden, M.D. [facility] [place]                             RADIATION ONCOLOGY END OF TREATMENT NOTE     Diagnosis: C50.411 Malignant neoplasm of upper-outer quadrant of right female breast Staging on 2024-04-29: Malignant neoplasm of upper-outer quadrant of right female breast (HCC) T=pT1b, N=pN0, M=cM0 Intent: Curative     ==========DELIVERED PLANS==========  First Treatment Date: 2024-06-09 Last Treatment Date: 2024-07-07   Plan Name: Breast_R_BO Site: Breast, Right Technique: 3D Mode: Photon Dose Per Fraction: 2.67 Gy Prescribed Dose (Delivered / Prescribed): 40.05 Gy / 40.05 Gy Prescribed Fxs (Delivered / Prescribed): 15 / 15   Plan Name: Brst_R_Bst_BO Site: Breast, Right Technique: Electron Mode: Electron Dose Per Fraction: 2 Gy Prescribed Dose (Delivered / Prescribed): 10 Gy / 10 Gy Prescribed Fxs (Delivered / Prescribed): 5 / 5     ==========ON TREATMENT VISIT DATES========== 2024-06-09, 2024-06-16, 2024-06-23, 2024-07-01, 2024-07-07     ==========UPCOMING VISITS========== 08/07/2024 [facility]RADIATION ONC FOLLOW UP 20 Golden Lauraine, MD  07/28/2024 [facility]-MED ONCOLOGY EST PT 15 STALLS AMBER, MD  07/18/2024 [facility] FOLLOW APOLINAR [name], MD        ==========APPENDIX - ON TREATMENT VISIT NOTES==========   See weekly On Treatment Notes in Epic for details in the Media tab (listed as Progress notes on the On Treatment Visit Dates listed above).

## 2024-07-09 ENCOUNTER — Ambulatory Visit

## 2024-07-14 ENCOUNTER — Encounter: Payer: Self-pay | Admitting: *Deleted

## 2024-07-14 NOTE — Progress Notes (Signed)
 Called to explain to pt about Survivorship and while I was on phone her husband wanted to cancel the appt with Dr. Loretha. I explained to husband that it's necessary for her to come back to see oncologist now she has completed radiation so we can plan next steps of care. He now understands and would like to attend appt with his wife and I explained to him 3 mos after that appt will be [facility] appt.

## 2024-07-15 NOTE — Assessment & Plan Note (Signed)
 She is anxious and probably depressed as well.  I will restart her on escitalopram  10 mg daily.  She does not have any suicidal ideation.  If she is not better then she will come back.

## 2024-07-15 NOTE — Assessment & Plan Note (Signed)
 He probably has stress-induced gastritis.  I have suggested her to take Protonix  40 mg daily for 3 weeks and take ondansetron  4 mg 30 minutes before meal.  If she is not better then she will come back.

## 2024-07-15 NOTE — Assessment & Plan Note (Signed)
 She will take Zofran  30 minutes before meal

## 2024-07-18 ENCOUNTER — Ambulatory Visit: Admitting: Internal Medicine

## 2024-07-28 ENCOUNTER — Inpatient Hospital Stay: Admitting: Hematology and Oncology

## 2024-07-31 ENCOUNTER — Telehealth: Payer: Self-pay

## 2024-07-31 NOTE — Telephone Encounter (Signed)
 Spoke with patient husband and he confirmed appointment on 10/3.SABRA

## 2024-07-31 NOTE — Progress Notes (Addendum)
  Tonya Summers presents today for follow-up after completing radiation to her right breast on 07/07/2024. Patient is doing well post radiation treatment and denies any negative symptoms.  Pain:Patient denies any breast pain Skin: Skin is doing well and is healing.  ROM: None Lymphedema: None MedOnc F/U: 12/19 2025 with Cornetto, NP  01/30/2025 with Iruku, MD Other issues of note:  Some fatigue   Pt reports Yes No Comments  Tamoxifen []  []    Letrozole []  []    Anastrazole [x]  []  Patient tolerating medication well  Mammogram []  Date:  []      Encouraged to call our office with any questions or concerns related to the radiation treatment

## 2024-08-01 ENCOUNTER — Inpatient Hospital Stay: Attending: Hematology and Oncology | Admitting: Hematology and Oncology

## 2024-08-01 VITALS — BP 124/84 | HR 71 | Temp 97.7°F | Resp 18 | Ht 67.0 in | Wt 118.2 lb

## 2024-08-01 DIAGNOSIS — Z923 Personal history of irradiation: Secondary | ICD-10-CM | POA: Insufficient documentation

## 2024-08-01 DIAGNOSIS — C50411 Malignant neoplasm of upper-outer quadrant of right female breast: Secondary | ICD-10-CM | POA: Diagnosis not present

## 2024-08-01 DIAGNOSIS — Z170 Estrogen receptor positive status [ER+]: Secondary | ICD-10-CM | POA: Insufficient documentation

## 2024-08-01 DIAGNOSIS — Z1721 Progesterone receptor positive status: Secondary | ICD-10-CM | POA: Diagnosis not present

## 2024-08-01 DIAGNOSIS — Z79811 Long term (current) use of aromatase inhibitors: Secondary | ICD-10-CM | POA: Diagnosis not present

## 2024-08-01 DIAGNOSIS — Z79899 Other long term (current) drug therapy: Secondary | ICD-10-CM | POA: Insufficient documentation

## 2024-08-01 DIAGNOSIS — Z1732 Human epidermal growth factor receptor 2 negative status: Secondary | ICD-10-CM | POA: Diagnosis not present

## 2024-08-01 MED ORDER — ANASTROZOLE 1 MG PO TABS: 1.0000 mg | ORAL_TABLET | Freq: Every day | ORAL | 3 refills | Status: AC

## 2024-08-01 NOTE — Progress Notes (Signed)
 [facility] CONSULT NOTE  Patient Care Team: [name], MD as PCP - General (Internal Medicine) Debrah Lamar BIRCH, MD (Inactive) as Consulting Physician (Gastroenterology) Rogelio Planas, MD as Consulting Physician (Obstetrics and Gynecology) Tyree Nanetta SAILOR, RN as Oncology Nurse Navigator [name], MD as Consulting Physician (Hematology and Oncology)  CHIEF COMPLAINTS/PURPOSE OF CONSULTATION:  Newly diagnosed breast cancer  HISTORY OF PRESENTING ILLNESS:  Tonya Summers 48 y.o. female is here because of recent diagnosis of right breast cancer  I reviewed her records extensively and collaborated the history with the patient.  SUMMARY OF ONCOLOGIC HISTORY: Oncology History  Malignant neoplasm of upper-outer quadrant of right female breast (HCC)  03/27/2024 Mammogram   New RIGHT breast mass measuring 12 x 6 x 14 mm, with associated loosely grouped round calcifications at the 9 o'clock retroareolar position in the RIGHT breast requires further characterization with ultrasound-guided biopsy.   2. RIGHT breast mass measuring 7 x 4 x 9 mm at the site of focal pain at 9 o'clock 5 cm from the nipple in the RIGHT breast is indeterminate and requires further characterization with ultrasound-guided biopsy.   3. No mammographic evidence of malignancy in the LEFT breast. No suspicious RIGHT axillary lymph nodes.   03/28/2024 Pathology Results   1. Breast, right, needle core biopsy, 9:00, 5 cmfn, coil :      INVASIVE MODERATELY DIFFERENTIATED DUCTAL ADENOCARCINOMA WITH EXTRACELLULAR      MUCIN, GRADE 2 (3+2+1)      DUCTAL CARCINOMA IN SITU, INTERMEDIATE NUCLEAR GRADE, CRIBRIFORM AND PAPILLARY      TYPES      TUBULE FORMATION: SCORE 3      NUCLEAR PLEOMORPHISM: SCORE 2      MITOTIC COUNT: SCORE 1      TOTAL SCORE: 6      OVERALL GRADE: GRADE 2 (6/9)      NEGATIVE FOR ANGIOLYMPHATIC INVASION      NEGATIVE FOR MICROCALCIFICATIONS      TUMOR MEASURES 10 MM IN  GREATEST LINEAR EXTENT       2. Breast, right, needle core biopsy, 9:00, retroareolar, heart clip :      DUCTAL CARCINOMA IN SITU, INTERMEDIATE TO HIGH NUCLEAR GRADE, WITH PAPILLARY      FEATURES      NEGATIVE FOR INVASIVE CARCINOMA      NEGATIVE FOR MICROCALCIFICATIONS      TUMOR MEASURES 5 MM IN GREATEST LINEAR EXTENT   Estrogen Receptor:  100%, POSITIVE, STRONG STAINING INTENSITY  Progesterone  Receptor:  30%, POSITIVE, MODERATE-STRONG STAINING INTENSITY  Proliferation Marker Ki67:  10%  The tumor cells are NEGATIVE for Her2 (1+).    03/31/2024 Initial Diagnosis   Malignant neoplasm of upper-outer quadrant of right female breast (HCC)   04/29/2024 Cancer Staging   Staging form: Breast, AJCC 8th Edition - Pathologic: Stage IA (pT1b, pN0, cM0, G2, ER+, PR+, HER2-) - Signed by [name], [name] on 04/29/2024 Multigene prognostic tests performed: Oncotype DX Histologic grading system: 3 grade system    Discussed the use of AI scribe software for clinical note transcription with the patient, who gave verbal consent to proceed.  History of Present Illness  Tonya Summers is a 48 year old female with breast cancer who presents for follow-up after radiation therapy. She is accompanied by a family member.  She has recently completed radiation therapy for breast cancer and is experiencing mild fatigue as a lingering side effect. She is postmenopausal, having not menstruated for almost three years, and did  not undergo a hysterectomy.  There is no personal or family history of blood clots. We discussed about tamoxifen as well as aromatase inhibitors. Rest of the pertinent 10 point ROS reviewed and neg.   MEDICAL HISTORY:  Past Medical History:  Diagnosis Date   Anxiety    Asthma    GERD (gastroesophageal reflux disease)     SURGICAL HISTORY: Past Surgical History:  Procedure Laterality Date   BREAST BIOPSY Right 07/2015   BREAST BIOPSY Right 03/28/2024   US  RT BREAST BX W LOC DEV EA  ADD LESION IMG BX SPEC US  GUIDE 03/28/2024 [facility]   BREAST BIOPSY Right 03/28/2024   US  RT BREAST BX W LOC DEV 1ST LESION IMG BX SPEC US  GUIDE 03/28/2024 [facility]   BREAST BIOPSY  04/11/2024   US  RT RADIOACTIVE SEED LOC 04/11/2024 [facility]   BREAST BIOPSY Right 04/11/2024   US  RT RADIOACTIVE SEED EA ADD LESION 04/11/2024 [facility]   BREAST EXCISIONAL BIOPSY Right 10/2015   BREAST LUMPECTOMY WITH RADIOACTIVE SEED AND SENTINEL LYMPH NODE BIOPSY Right 04/11/2024   Procedure: BREAST LUMPECTOMY WITH RADIOACTIVE SEED AND SENTINEL LYMPH NODE BIOPSY;  Surgeon: [name] Deward MOULD, MD;  Location: [facility];  Service: General;  Laterality: Right;  RIGHT BREAST RADIOACTIVE SEED LOCALIZED LUMPECTOMY x2 SENTINEL NODE BIOPSY   CHOLECYSTECTOMY     RADIOACTIVE SEED GUIDED EXCISIONAL BREAST BIOPSY Right 11/25/2015   Procedure: RADIOACTIVE SEED GUIDED EXCISIONAL BREAST BIOPSY;  Surgeon: Jina Nephew, MD;  Location: [facility];  Service: General;  Laterality: Right;    SOCIAL HISTORY: Social History   Socioeconomic History   Marital status: Married    Spouse name: Mohammed   Number of children: 1   Years of education: Not on file   Highest education level: Not on file  Occupational History   Not on file  Tobacco Use   Smoking status: Never   Smokeless tobacco: Never  Vaping Use   Vaping status: Never Used  Substance and Sexual Activity   Alcohol use: No    Alcohol/week: 0.0 standard drinks of alcohol   Drug use: No   Sexual activity: Not Currently    Partners: Male    Birth control/protection: None  Other Topics Concern   Not on file  Social History Narrative   Marital status: married      Children: 1 child (9)      Moved from [place] in 2016   Social Drivers of Corporate investment banker Strain: Not on file  Food Insecurity: No Food Insecurity (04/07/2024)   Hunger Vital Sign    Worried About Radiation protection practitioner of Food in the Last Year:  Never true    Ran Out of Food in the Last Year: Never true  Transportation Needs: No Transportation Needs (04/07/2024)   PRAPARE - Administrator, Civil Service (Medical): No    Lack of Transportation (Non-Medical): No  Physical Activity: Not on file  Stress: Not on file  Social Connections: Not on file  Intimate Partner Violence: Not At Risk (04/07/2024)   Humiliation, Afraid, Rape, and Kick questionnaire    Fear of Current or Ex-Partner: No    Emotionally Abused: No    Physically Abused: No    Sexually Abused: No    FAMILY HISTORY: Family History  Problem Relation Age of Onset   Colon cancer Neg Hx    Stomach cancer Neg Hx    Pancreatic cancer Neg Hx    Esophageal  cancer Neg Hx    Rectal cancer Neg Hx     ALLERGIES:  is allergic to no known allergies.  MEDICATIONS:  Current Outpatient Medications  Medication Sig Dispense Refill   anastrozole (ARIMIDEX) 1 MG tablet Take 1 tablet (1 mg total) by mouth daily. 90 tablet 3   escitalopram  (LEXAPRO ) 10 MG tablet Take 1 tablet (10 mg total) by mouth daily. 30 tablet 3   pantoprazole  (PROTONIX ) 40 MG tablet Take 1 tablet (40 mg total) by mouth daily. 30 tablet 3   No current facility-administered medications for this visit.     All other systems were reviewed with the patient and are negative.  PHYSICAL EXAMINATION: ECOG PERFORMANCE STATUS: 0 - Asymptomatic  Vitals:   08/01/24 1232  BP: 124/84  Pulse: 71  Resp: 18  Temp: 97.7 F (36.5 C)  SpO2: 100%   Filed Weights   08/01/24 1232  Weight: 118 lb 3.2 oz (53.6 kg)    GENERAL:alert, no distress and comfortable  LABORATORY DATA:  I have reviewed the data as listed Lab Results  Component Value Date   WBC 2.6 (L) 11/29/2023   HGB 12.1 11/29/2023   HCT 36.5 11/29/2023   MCV 90 11/29/2023   PLT 128 (L) 11/29/2023   Lab Results  Component Value Date   NA CANCELED 11/29/2023   K CANCELED 11/29/2023   CL CANCELED 11/29/2023   CO2 20 11/29/2023     RADIOGRAPHIC STUDIES: I have personally reviewed the radiological reports and agreed with the findings in the report.  ASSESSMENT AND PLAN:  Malignant neoplasm of upper-outer quadrant of right female breast (HCC) Assessment and Plan Assessment & Plan Invasive ductal carcinoma of right breast Invasive ductal carcinoma, 12 mm, grade 2, stage 1, ER/PR-positive, HER2-negative, KI-67 10%.  Final pathology with 6 mm tumor with clear surgical margins and lymph nodes. Oncotype score of 9 indicates low recurrence risk.  She is now post adj radiation and is here to discuss antiestrogen therapy With regards to Tamoxifen, we discussed that this is a SERM, selective estrogen receptor modulator. We discussed mechanism of action of Tamoxifen, adverse effects on Tamoxifen including but not limited to post menopausal symptoms, increased risk of DVT/PE, increased risk of endometrial cancer, questionable cataracts with long term use and increased risk of cardiovascular events in the study which was not statistically significant. A benefit from Tamoxifen would be improvement in bone density. With regards to aromatase inhibitors, we discussed mechanism of action, adverse effects including but not limited to post menopausal symptoms, arthralgias, myalgias, increased risk of cardiovascular events and bone loss.  She will proceed with anti estrogen therapy, aromatase inhibitors preferred We will order baseline bone density for monitoring. RTC in 3 months as scheduled for SCP visit and toxicity check.        All questions were answered. The patient knows to call the clinic with any problems, questions or concerns.    Amber Stalls, MD 08/05/24

## 2024-08-05 NOTE — Addendum Note (Signed)
 Addended by: Edana Aguado, NAGA VENKATA KALIPRAVEENA on: 08/05/2024 08:22 AM   Modules accepted: Orders

## 2024-08-05 NOTE — Assessment & Plan Note (Signed)
 Assessment and Plan Assessment & Plan Invasive ductal carcinoma of right breast Invasive ductal carcinoma, 12 mm, grade 2, stage 1, ER/PR-positive, HER2-negative, KI-67 10%.  Final pathology with 6 mm tumor with clear surgical margins and lymph nodes. Oncotype score of 9 indicates low recurrence risk.  She is now post adj radiation and is here to discuss antiestrogen therapy With regards to Tamoxifen, we discussed that this is a SERM, selective estrogen receptor modulator. We discussed mechanism of action of Tamoxifen, adverse effects on Tamoxifen including but not limited to post menopausal symptoms, increased risk of DVT/PE, increased risk of endometrial cancer, questionable cataracts with long term use and increased risk of cardiovascular events in the study which was not statistically significant. A benefit from Tamoxifen would be improvement in bone density. With regards to aromatase inhibitors, we discussed mechanism of action, adverse effects including but not limited to post menopausal symptoms, arthralgias, myalgias, increased risk of cardiovascular events and bone loss.  She will proceed with anti estrogen therapy, aromatase inhibitors preferred We will order baseline bone density for monitoring. RTC in 3 months as scheduled for SCP visit and toxicity check.

## 2024-08-07 ENCOUNTER — Ambulatory Visit
Admission: RE | Admit: 2024-08-07 | Discharge: 2024-08-07 | Disposition: A | Discharge status: Home / Self Care | Source: Ambulatory Visit | Attending: Radiation Oncology | Admitting: Radiation Oncology

## 2024-08-07 DIAGNOSIS — C50411 Malignant neoplasm of upper-outer quadrant of right female breast: Secondary | ICD-10-CM

## 2024-08-07 NOTE — Addendum Note (Signed)
 Encounter addended by: Pavlock-Medani, Elverna LABOR, RN on: 08/07/2024 2:18 PM  Actions taken: Clinical Note Signed, Demographics modified

## 2024-08-07 NOTE — Addendum Note (Signed)
 Encounter addended by: Furtick-Medani, Elverna LABOR, RN on: 08/07/2024 2:11 PM  Actions taken: Clinical Note Signed

## 2024-08-08 ENCOUNTER — Other Ambulatory Visit: Payer: Self-pay | Admitting: Internal Medicine

## 2024-09-05 ENCOUNTER — Telehealth: Payer: Self-pay

## 2024-09-05 NOTE — Telephone Encounter (Signed)
 S/w patient's husband, Abdalla, regarding questions about cream needed and vitamin recommendation after completion of treatment.  Returned call - husband states that he meant to reach out to the radonc department. Call transferred to Dr. Charisse nurse.

## 2024-09-23 ENCOUNTER — Ambulatory Visit: Admitting: Gastroenterology

## 2024-10-10 ENCOUNTER — Encounter: Payer: Self-pay | Admitting: *Deleted

## 2024-10-17 ENCOUNTER — Encounter: Admitting: Adult Health

## 2024-11-18 ENCOUNTER — Inpatient Hospital Stay: Admitting: Adult Health

## 2024-11-18 VITALS — BP 145/52 | HR 87 | Temp 98.0°F | Resp 16 | Wt 118.2 lb

## 2024-11-18 DIAGNOSIS — Z170 Estrogen receptor positive status [ER+]: Secondary | ICD-10-CM | POA: Insufficient documentation

## 2024-11-18 DIAGNOSIS — Z923 Personal history of irradiation: Secondary | ICD-10-CM | POA: Diagnosis not present

## 2024-11-18 DIAGNOSIS — C50411 Malignant neoplasm of upper-outer quadrant of right female breast: Secondary | ICD-10-CM | POA: Diagnosis not present

## 2024-11-18 DIAGNOSIS — Z1732 Human epidermal growth factor receptor 2 negative status: Secondary | ICD-10-CM | POA: Insufficient documentation

## 2024-11-18 DIAGNOSIS — Z1721 Progesterone receptor positive status: Secondary | ICD-10-CM | POA: Insufficient documentation

## 2024-11-18 DIAGNOSIS — Z79811 Long term (current) use of aromatase inhibitors: Secondary | ICD-10-CM | POA: Diagnosis not present

## 2024-11-18 NOTE — Progress Notes (Unsigned)
 SURVIVORSHIP VISIT:  BRIEF ONCOLOGIC HISTORY:  Oncology History  Malignant neoplasm of upper-outer quadrant of right female breast (HCC)  03/27/2024 Mammogram   New RIGHT breast mass measuring 12 x 6 x 14 mm, with associated loosely grouped round calcifications at the 9 o'clock retroareolar position in the RIGHT breast requires further characterization with ultrasound-guided biopsy.   2. RIGHT breast mass measuring 7 x 4 x 9 mm at the site of focal pain at 9 o'clock 5 cm from the nipple in the RIGHT breast is indeterminate and requires further characterization with ultrasound-guided biopsy.   3. No mammographic evidence of malignancy in the LEFT breast. No suspicious RIGHT axillary lymph nodes.   03/28/2024 Pathology Results   1. Breast, right, needle core biopsy, 9:00, 5 cmfn, coil :      INVASIVE MODERATELY DIFFERENTIATED DUCTAL ADENOCARCINOMA WITH EXTRACELLULAR      MUCIN, GRADE 2 (3+2+1)      DUCTAL CARCINOMA IN SITU, INTERMEDIATE NUCLEAR GRADE, CRIBRIFORM AND PAPILLARY      TYPES      TUBULE FORMATION: SCORE 3      NUCLEAR PLEOMORPHISM: SCORE 2      MITOTIC COUNT: SCORE 1      TOTAL SCORE: 6      OVERALL GRADE: GRADE 2 (6/9)      NEGATIVE FOR ANGIOLYMPHATIC INVASION      NEGATIVE FOR MICROCALCIFICATIONS      TUMOR MEASURES 10 MM IN GREATEST LINEAR EXTENT       2. Breast, right, needle core biopsy, 9:00, retroareolar, heart clip :      DUCTAL CARCINOMA IN SITU, INTERMEDIATE TO HIGH NUCLEAR GRADE, WITH PAPILLARY      FEATURES      NEGATIVE FOR INVASIVE CARCINOMA      NEGATIVE FOR MICROCALCIFICATIONS      TUMOR MEASURES 5 MM IN GREATEST LINEAR EXTENT   Estrogen Receptor:  100%, POSITIVE, STRONG STAINING INTENSITY  Progesterone  Receptor:  30%, POSITIVE, MODERATE-STRONG STAINING INTENSITY  Proliferation Marker Ki67:  10%  The tumor cells are NEGATIVE for Her2 (1+).    03/31/2024 Initial Diagnosis   Malignant neoplasm of upper-outer quadrant of right female breast (HCC)    04/11/2024 Surgery   RIGHT BREAST LUMPECTOMY WITH 2 SEEDS: Grade 2 Invasive Ductal Carcinoma; intermediate grade DCIS; ER/PR+, HER2-; 0/2 lymph nodes positive for metastatic carcinoma; negative margins   04/29/2024 Cancer Staging   Staging form: Breast, AJCC 8th Edition - Pathologic: Stage IA (pT1b, pN0, cM0, G2, ER+, PR+, HER2-) - Signed by [name], [name] on 04/29/2024 Multigene prognostic tests performed: Oncotype DX Histologic grading system: 3 grade system   06/09/2024 - 07/07/2024 Radiation Therapy   Plan Name: Breast_R_BO Site: Breast, Right Technique: 3D Mode: Photon Dose Per Fraction: 2.67 Gy Prescribed Dose (Delivered / Prescribed): 40.05 Gy / 40.05 Gy Prescribed Fxs (Delivered / Prescribed): 15 / 15   Plan Name: Brst_R_Bst_BO Site: Breast, Right Technique: Electron Mode: Electron Dose Per Fraction: 2 Gy Prescribed Dose (Delivered / Prescribed): 10 Gy / 10 Gy Prescribed Fxs (Delivered / Prescribed): 5 / 5   07/2024 -  Anti-estrogen oral therapy   Anastrozole  daily     INTERVAL HISTORY:  Tonya Summers to review her survivorship care plan detailing her treatment course for breast cancer, as well as monitoring long-term side effects of that treatment, education regarding health maintenance, screening, and overall wellness and health promotion.     Overall, Tonya Summers reports feeling quite well   REVIEW OF SYSTEMS:  Review of  Systems  Constitutional:  Negative for appetite change, chills, fatigue, fever and unexpected weight change.  HENT:   Negative for hearing loss, lump/mass and trouble swallowing.   Eyes:  Negative for eye problems and icterus.  Respiratory:  Negative for chest tightness, cough and shortness of breath.   Cardiovascular:  Negative for chest pain, leg swelling and palpitations.  Gastrointestinal:  Negative for abdominal distention, abdominal pain, constipation, diarrhea, nausea and vomiting.  Endocrine: Negative for hot flashes.  Genitourinary:   Negative for difficulty urinating.   Musculoskeletal:  Negative for arthralgias.  Skin:  Negative for itching and rash.  Neurological:  Negative for dizziness, extremity weakness, headaches and numbness.  Hematological:  Negative for adenopathy. Does not bruise/bleed easily.  Psychiatric/Behavioral:  Negative for depression. The patient is not nervous/anxious.    Breast: Denies any new nodularity, masses, tenderness, nipple changes, or nipple discharge.       PAST MEDICAL/SURGICAL HISTORY:  Past Medical History:  Diagnosis Date   Anxiety    Asthma    GERD (gastroesophageal reflux disease)    Past Surgical History:  Procedure Laterality Date   BREAST BIOPSY Right 07/2015   BREAST BIOPSY Right 03/28/2024   US  RT BREAST BX W LOC DEV EA ADD LESION IMG BX SPEC US  GUIDE 03/28/2024 [facility]   BREAST BIOPSY Right 03/28/2024   US  RT BREAST BX W LOC DEV 1ST LESION IMG BX SPEC US  GUIDE 03/28/2024 [facility]   BREAST BIOPSY  04/11/2024   US  RT RADIOACTIVE SEED LOC 04/11/2024 [facility]   BREAST BIOPSY Right 04/11/2024   US  RT RADIOACTIVE SEED EA ADD LESION 04/11/2024 [facility]   BREAST EXCISIONAL BIOPSY Right 10/2015   BREAST LUMPECTOMY WITH RADIOACTIVE SEED AND SENTINEL LYMPH NODE BIOPSY Right 04/11/2024   Procedure: BREAST LUMPECTOMY WITH RADIOACTIVE SEED AND SENTINEL LYMPH NODE BIOPSY;  Surgeon: [name] Deward MOULD, MD;  Location: [facility];  Service: General;  Laterality: Right;  RIGHT BREAST RADIOACTIVE SEED LOCALIZED LUMPECTOMY x2 SENTINEL NODE BIOPSY   CHOLECYSTECTOMY     RADIOACTIVE SEED GUIDED EXCISIONAL BREAST BIOPSY Right 11/25/2015   Procedure: RADIOACTIVE SEED GUIDED EXCISIONAL BREAST BIOPSY;  Surgeon: Jina Nephew, MD;  Location: [facility];  Service: General;  Laterality: Right;     ALLERGIES:  Allergies[1]   CURRENT MEDICATIONS:  Outpatient Encounter Medications as of 11/18/2024  Medication Sig   anastrozole   (ARIMIDEX ) 1 MG tablet Take 1 tablet (1 mg total) by mouth daily.   escitalopram  (LEXAPRO ) 10 MG tablet Take 1 tablet (10 mg total) by mouth daily.   pantoprazole  (PROTONIX ) 40 MG tablet TAKE 1 TABLET(40 MG) BY MOUTH DAILY   No facility-administered encounter medications on file as of 11/18/2024.     ONCOLOGIC FAMILY HISTORY:  Family History  Problem Relation Age of Onset   Colon cancer Neg Hx    Stomach cancer Neg Hx    Pancreatic cancer Neg Hx    Esophageal cancer Neg Hx    Rectal cancer Neg Hx      SOCIAL HISTORY:  Social History   Socioeconomic History   Marital status: Married    Spouse name: Mohammed   Number of children: 1   Years of education: Not on file   Highest education level: Not on file  Occupational History   Not on file  Tobacco Use   Smoking status: Never   Smokeless tobacco: Never  Vaping Use   Vaping status: Never Used  Substance and Sexual Activity   Alcohol  use: No    Alcohol/week: 0.0 standard drinks of alcohol   Drug use: No   Sexual activity: Not Currently    Partners: Male    Birth control/protection: None  Other Topics Concern   Not on file  Social History Narrative   Marital status: married      Children: 1 child (87)      Moved from [place] in 2016   Social Drivers of Health   Tobacco Use: Low Risk (06/13/2024)   Patient History    Smoking Tobacco Use: Never    Smokeless Tobacco Use: Never    Passive Exposure: Not on file  Financial Resource Strain: Not on file  Food Insecurity: No Food Insecurity (04/07/2024)   Hunger Vital Sign    Worried About Running Out of Food in the Last Year: Never true    Ran Out of Food in the Last Year: Never true  Transportation Needs: No Transportation Needs (04/07/2024)   PRAPARE - Administrator, Civil Service (Medical): No    Lack of Transportation (Non-Medical): No  Physical Activity: Not on file  Stress: Not on file  Social Connections: Not on file  Intimate Partner Violence: Not At  Risk (04/07/2024)   Humiliation, Afraid, Rape, and Kick questionnaire    Fear of Current or Ex-Partner: No    Emotionally Abused: No    Physically Abused: No    Sexually Abused: No  Depression (PHQ2-9): Low Risk (04/07/2024)   Depression (PHQ2-9)    PHQ-2 Score: 0  Alcohol Screen: Not on file  Housing: High Risk (04/07/2024)   Housing Stability Vital Sign    Unable to Pay for Housing in the Last Year: Yes    Number of Times Moved in the Last Year: 0    Homeless in the Last Year: No  Utilities: Not At Risk (04/07/2024)   [employer] Utilities    Threatened with loss of utilities: No  Health Literacy: Not on file     OBSERVATIONS/OBJECTIVE:  LMP 04/22/2020  GENERAL: Patient is a well appearing female in no acute distress HEENT:  Sclerae anicteric.  Oropharynx clear and moist. No ulcerations or evidence of oropharyngeal candidiasis. Neck is supple.  NODES:  No cervical, supraclavicular, or axillary lymphadenopathy palpated.  BREAST EXAM:  Deferred. LUNGS:  Clear to auscultation bilaterally.  No wheezes or rhonchi. HEART:  Regular rate and rhythm. No murmur appreciated. ABDOMEN:  Soft, nontender.  Positive, normoactive bowel sounds. No organomegaly palpated. MSK:  No focal spinal tenderness to palpation. Full range of motion bilaterally in the upper extremities. EXTREMITIES:  No peripheral edema.   SKIN:  Clear with no obvious rashes or skin changes. No nail dyscrasia. NEURO:  Nonfocal. Well oriented.  Appropriate affect.   LABORATORY DATA:  None for this visit.  DIAGNOSTIC IMAGING:  None for this visit.      ASSESSMENT AND PLAN:  Tonya Summers is a pleasant 49 y.o. female with Stage IA right breast invasive ductal carcinoma, ER+/PR+/HER2-, diagnosed in 02/2024, treated with lumpectomy, adjuvant radiation therapy, and anti-estrogen therapy with Anastrozole  beginning in 07/2024.  She presents to the Survivorship Clinic for our initial meeting and routine follow-up post-completion of treatment  for breast cancer.    1. Stage IA right breast cancer:  Tonya Summers is continuing to recover from definitive treatment for breast cancer. She will follow-up with her medical oncologist, Dr.  Loretha  with history and physical exam per surveillance protocol.  She will continue her anti-estrogen therapy with Anastrozole . Thus  far, she is tolerating the Anastrozole  well, with minimal side effects. Her mammogram is due ***; orders placed today.   Today, a comprehensive survivorship care plan and treatment summary was reviewed with the patient today detailing her breast cancer diagnosis, treatment course, potential late/long-term effects of treatment, appropriate follow-up care with recommendations for the future, and patient education resources.  A copy of this summary, along with a letter will be sent to the patients primary care provider via mail/fax/In Basket message after todays visit.    #. Problem(s) at Visit______________  #. Bone health:  Given Tonya Summers's age/history of breast cancer and her current treatment regimen including anti-estrogen therapy with ***, she is at risk for bone demineralization.  Her last DEXA scan was ***, which showed ***.  In the meantime, she was encouraged to increase her consumption of foods rich in calcium, as well as increase her weight-bearing activities.  She was given education on specific activities to promote bone health.  #. Cancer screening:  Due to Tonya Summers history and her age, she should receive screening for skin cancers, colon cancer, and gynecologic cancers.  The information and recommendations are listed on the patient's comprehensive care plan/treatment summary and were reviewed in detail with the patient.    #. Health maintenance and wellness promotion: Tonya Summers was encouraged to consume 5-7 servings of fruits and vegetables per day. We reviewed the [employer] handout.  She was also encouraged to engage in moderate to vigorous exercise  for 30 minutes per day most days of the week.  She was instructed to limit her alcohol consumption and continue to abstain from tobacco use/***was encouraged stop smoking.     #. Support services/counseling: It is not uncommon for this period of the patient's cancer care trajectory to be one of many emotions and stressors.   She was given information regarding our available services and encouraged to contact me with any questions or for help enrolling in any of our support group/programs.    Follow up instructions:    -Return to cancer center ***  -Mammogram due in *** -She is welcome to return back to the Survivorship Clinic at any time; no additional follow-up needed at this time.  -Consider referral back to survivorship as a long-term survivor for continued surveillance  The patient was provided an opportunity to ask questions and all were answered. The patient agreed with the plan and demonstrated an understanding of the instructions.   Total encounter time:*** minutes*in face-to-face visit time, chart review, lab review, care coordination, order entry, and documentation of the encounter time.    Morna Kendall, NP 11/18/24 1:56 PM Medical Oncology and Hematology [facility] [address] [place], [place] [address] Tel. [phone]    Fax. [phone]  *Total Encounter Time as defined by the Centers for Medicare and Medicaid Services includes, in addition to the face-to-face time of a patient visit (documented in the note above) non-face-to-face time: obtaining and reviewing outside history, ordering and reviewing medications, tests or procedures, care coordination (communications with other health care professionals or caregivers) and documentation in the medical record.     [1]  Allergies Allergen Reactions   No Known Allergies

## 2025-01-30 ENCOUNTER — Ambulatory Visit: Admitting: Hematology and Oncology
